# Patient Record
Sex: Female | Born: 1990 | Race: White | Hispanic: No | Smoking: Current every day smoker
Health system: Southern US, Community
[De-identification: ages and names within clinical notes are randomized; demographics above are authoritative.]

## PROBLEM LIST (undated history)

## (undated) DIAGNOSIS — O99345 Other mental disorders complicating the puerperium: Principal | ICD-10-CM

## (undated) DIAGNOSIS — F53 Postpartum depression: Principal | ICD-10-CM

## (undated) HISTORY — DX: Postpartum depression: F53.0

## (undated) HISTORY — DX: Other mental disorders complicating the puerperium: O99.345

---
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
## 2009-01-01 ENCOUNTER — Ambulatory Visit: Payer: Self-pay | Admitting: Family Medicine

## 2009-01-01 ENCOUNTER — Encounter: Payer: Self-pay | Admitting: Family Medicine

## 2009-01-01 LAB — CONVERTED CEMR LAB
Albumin: 3.8 g/dL (ref 3.5–5.2)
Alkaline Phosphatase: 246 units/L — ABNORMAL HIGH (ref 39–117)
BUN: 6 mg/dL (ref 6–23)
Calcium: 9.4 mg/dL (ref 8.4–10.5)
Chloride: 103 meq/L (ref 96–112)
Glucose, Bld: 62 mg/dL — ABNORMAL LOW (ref 70–99)
Hemoglobin: 12.5 g/dL (ref 12.0–15.0)
MCHC: 32.6 g/dL (ref 30.0–36.0)
Potassium: 3.9 meq/L (ref 3.5–5.3)
RBC: 4.53 M/uL (ref 3.87–5.11)
TSH: 2.233 microintl units/mL (ref 0.700–6.400)

## 2009-01-02 ENCOUNTER — Encounter: Payer: Self-pay | Admitting: Family Medicine

## 2009-01-02 ENCOUNTER — Ambulatory Visit (HOSPITAL_COMMUNITY): Admission: RE | Admit: 2009-01-02 | Discharge: 2009-01-02 | Payer: Self-pay | Admitting: Family Medicine

## 2009-01-08 ENCOUNTER — Encounter: Payer: Self-pay | Admitting: Family Medicine

## 2009-01-12 ENCOUNTER — Ambulatory Visit: Payer: Self-pay | Admitting: Obstetrics and Gynecology

## 2009-01-12 ENCOUNTER — Ambulatory Visit: Payer: Self-pay | Admitting: Family Medicine

## 2009-01-12 ENCOUNTER — Inpatient Hospital Stay (HOSPITAL_COMMUNITY): Admission: AD | Admit: 2009-01-12 | Discharge: 2009-01-16 | Payer: Self-pay | Admitting: Family Medicine

## 2009-01-12 ENCOUNTER — Encounter: Payer: Self-pay | Admitting: Family Medicine

## 2009-01-12 LAB — CONVERTED CEMR LAB
Chlamydia, DNA Probe: NEGATIVE
GC Probe Amp, Genital: POSITIVE — AB

## 2009-10-01 ENCOUNTER — Ambulatory Visit: Payer: Self-pay | Admitting: Family Medicine

## 2009-10-01 LAB — CONVERTED CEMR LAB: Beta hcg, urine, semiquantitative: POSITIVE

## 2009-12-21 ENCOUNTER — Encounter: Payer: Self-pay | Admitting: Family Medicine

## 2009-12-21 ENCOUNTER — Ambulatory Visit: Payer: Self-pay | Admitting: Family Medicine

## 2009-12-21 LAB — CONVERTED CEMR LAB
Basophils Absolute: 0 10*3/uL (ref 0.0–0.1)
Basophils Relative: 0 % (ref 0–1)
MCHC: 33.9 g/dL (ref 30.0–36.0)
Neutro Abs: 12.5 10*3/uL — ABNORMAL HIGH (ref 1.7–7.7)
Neutrophils Relative %: 79 % — ABNORMAL HIGH (ref 43–77)
Platelets: 273 10*3/uL (ref 150–400)
RDW: 14.2 % (ref 11.5–15.5)
Rubella: 68.8 intl units/mL — ABNORMAL HIGH
Sickle Cell Screen: NEGATIVE

## 2009-12-24 ENCOUNTER — Encounter: Payer: Self-pay | Admitting: Family Medicine

## 2009-12-28 ENCOUNTER — Encounter: Payer: Self-pay | Admitting: *Deleted

## 2010-01-17 ENCOUNTER — Ambulatory Visit: Payer: Self-pay | Admitting: Family Medicine

## 2010-01-17 ENCOUNTER — Encounter: Payer: Self-pay | Admitting: Family Medicine

## 2010-01-17 DIAGNOSIS — N390 Urinary tract infection, site not specified: Secondary | ICD-10-CM | POA: Insufficient documentation

## 2010-01-17 LAB — CONVERTED CEMR LAB
Chlamydia, DNA Probe: NEGATIVE
Pap Smear: NEGATIVE

## 2010-01-18 ENCOUNTER — Ambulatory Visit (HOSPITAL_COMMUNITY)
Admission: RE | Admit: 2010-01-18 | Discharge: 2010-01-18 | Payer: Self-pay | Source: Home / Self Care | Attending: Family Medicine | Admitting: Family Medicine

## 2010-02-06 ENCOUNTER — Ambulatory Visit: Payer: Self-pay | Admitting: Family Medicine

## 2010-02-26 ENCOUNTER — Telehealth: Payer: Self-pay | Admitting: Family Medicine

## 2010-02-28 ENCOUNTER — Ambulatory Visit: Admit: 2010-02-28 | Payer: Self-pay

## 2010-03-07 ENCOUNTER — Encounter: Payer: Self-pay | Admitting: Family Medicine

## 2010-03-07 ENCOUNTER — Other Ambulatory Visit: Payer: Self-pay | Admitting: Family Medicine

## 2010-03-07 ENCOUNTER — Ambulatory Visit: Admission: RE | Admit: 2010-03-07 | Discharge: 2010-03-07 | Payer: Self-pay | Source: Home / Self Care

## 2010-03-07 DIAGNOSIS — F17200 Nicotine dependence, unspecified, uncomplicated: Secondary | ICD-10-CM | POA: Insufficient documentation

## 2010-03-07 DIAGNOSIS — Z364 Encounter for antenatal screening for fetal growth retardation: Secondary | ICD-10-CM

## 2010-03-07 LAB — CONVERTED CEMR LAB
Bilirubin Urine: NEGATIVE
GC Probe Amp, Genital: NEGATIVE
Glucose, Urine, Semiquant: NEGATIVE
Specific Gravity, Urine: >=1.03
Urobilinogen, UA: 0.2
WBC Urine, dipstick: NEGATIVE

## 2010-03-08 ENCOUNTER — Encounter: Payer: Self-pay | Admitting: Family Medicine

## 2010-03-12 NOTE — Miscellaneous (Signed)
Summary: UTI Tx  Clinical Lists Changes  Medications: Added new medication of NITROFURANTOIN MONOHYD MACRO 100 MG CAPS (NITROFURANTOIN MONOHYD MACRO) one tab every 12 hrs with food

## 2010-03-12 NOTE — Assessment & Plan Note (Signed)
Summary: preg test,df   Vitals Entered By: Renato Battles slade,cma CC: pt does not know her date of LMP. has been  on the Depo, but d/c due to heavy bleeding. Pt has a 56 mos old baby and does not want to be pregnant. I have given to pt a verification letter for her pregnancy, so she can apply for Medicaid. She will sched. an OV with her PCP or first available MD. Then she will have an [place] and she wants to think about it, if she will have an abortion. She lives with her mother and she said, that she thinks, that her mother will kick her out of the house, if she has another baby.  Is Patient Diabetic? No Pain Assessment Patient in pain? no        CC:  pt does not know her date of LMP. has been  on the Depo, but d/c due to heavy bleeding. Pt has a 39 mos old baby and does not want to be pregnant. I have given to pt a verification letter for her pregnancy, so she can apply for Medicaid. She will sched. an OV with her PCP or first available MD. Then she will have an [place] and she wants to think about it, if she will have an abortion. She lives with her mother and she said, that she thinks, that her mother will kick her out of the house, and if she has another baby. .   Complete Medication List: 1)  Fluconazole 150 Mg Tabs (Fluconazole) .Marland Kitchen.. 1 tab by mouth x1 for yeast infection  Other Orders: U Preg-[facility] (16109) Est Level 1- [facility] [phone])  Laboratory Results   Urine Tests  Date/Time Received: October 01, 2009 3:41 PM  Date/Time Reported: October 01, 2009 4:03 PM     Urine HCG: positive Comments: ...........test performed by...........Marland KitchenTerese Door, CMA

## 2010-03-12 NOTE — Assessment & Plan Note (Signed)
Summary: NOB/DSL  Patient arrived late to NOB appt, was rescheduled .............................................Marland KitchenGaren Grams LPN December 28, 2009 3:43 PM    Complete Medication List: 1)  Fluconazole 150 Mg Tabs (Fluconazole) .Marland Kitchen.. 1 tab by mouth x1 for yeast infection 2)  Nitrofurantoin Monohyd Macro 100 Mg Caps (Nitrofurantoin monohyd macro) .... One tab every 12 hrs with food  Other Orders: No Charge Patient Arrived (NCPA0) (NCPA0)   Orders Added: 1)  No Charge Patient Arrived (NCPA0) [NCPA0]

## 2010-03-13 ENCOUNTER — Ambulatory Visit (HOSPITAL_COMMUNITY)
Admission: RE | Admit: 2010-03-13 | Discharge: 2010-03-13 | Disposition: A | Discharge status: Home / Self Care | Payer: Self-pay | Source: Ambulatory Visit | Attending: Family Medicine | Admitting: Family Medicine

## 2010-03-13 ENCOUNTER — Encounter (HOSPITAL_COMMUNITY): Payer: Self-pay

## 2010-03-13 DIAGNOSIS — Z364 Encounter for antenatal screening for fetal growth retardation: Secondary | ICD-10-CM

## 2010-03-13 DIAGNOSIS — Z3689 Encounter for other specified antenatal screening: Secondary | ICD-10-CM | POA: Insufficient documentation

## 2010-03-13 DIAGNOSIS — O365990 Maternal care for other known or suspected poor fetal growth, unspecified trimester, not applicable or unspecified: Secondary | ICD-10-CM | POA: Insufficient documentation

## 2010-03-14 NOTE — Assessment & Plan Note (Signed)
Summary: ob f/u [facility]   Vital Signs:  Patient profile:   20 year old female Weight:      150.4 pounds Temp:     98.3 degrees F oral Pulse rate:   94 / minute BP sitting:   101 / 64  Vitals Entered By: Alexandra Grams LPN (March 07, 2010 10:45 AM)  Primary Provider:  Angelena Sole MD   History of Present Illness: 20 yo G2P1001 at [redacted]w[redacted]d here for f/u OB.  No concerns this visit. Took antibiotics for UTI and BV.   Still having white vaginal discharge- denies itching and burning. Treated for gonorrhea at last visit. Not sure of FOB has been treated. Still sexually active with him. Not using condoms.   Nutrition- pt typically eats twice a day with a few snacks. Appetite decreased bc pt has cut back on smoking. Per pt appetite was better when smoking. Pt now smokes 2-3 cigs/day (was smoking 1PPD) and marijuana 2-3x/day (was smoking "all day"). Pt does not wake up in the middle of night. Does smoke first thing in the morning. Cut back in Oct/Nov. Taking prenatal vitamin.  PCM- Pt avoiding Alexandra Short. Spoke with pt about the benefits of working with [facility] (resources for more affordable baby clothes, counselliing, home visits from nurse). Advised pt to touch bases with Alexandra Short to get plugged in.      Social History: Packs/Day:  2-3 cigs/day  Physical Exam  General:  Vitas reviewed. well-developed, well-nourished, well-hydrated, and appropriate dress.  Smells of cigarette and marijuana smoke.  Abdomen:  gravid. non-tender.   postive fetal movement on exam. FH- 24 cm FHR-140 cm.   Pelvic Exam  Vulva:      normal appearance, normal hair distribution.   Vagina:      no lesions, copious discharge.  vaginal discharge-white, no odor.  Cervix:      normal, midposition, no CMT, no lesions.  cervix closed, long, high Uterus:      gravid.   Adnexa:      normal.      Impression & Recommendations:  Problem # 1:  PREGNANCY, NORMAL, MULTIGRAVIDA (ICD-V22.1) A: 19 yo G2P1001at 29w  and 1 d by 22 w U/S.  Labs: O +, Ab neg, Alexandra Short neg, RPR neg, HIV neg, Rubella immune. H/H 11.4/33.6, Plt 273, GBS neg. GC positive. Chlamydia negative.  ? Growth restriction - normal BP, pt smoking tobacco and marijuana.    P: U/S to assess growth 2/1/2.  d/u in OB clinic in 1 week. f/u with me in 2 weeks.   Orders: Prenatal U/S > 14 weeks - 81191 (Prenatal U/S) Other OB visit- [facility] (OBCK)  Problem # 2:  GONORRHEA (ICD-098.0) Pt treated at last visit (02/07/11) culture obtained for TOC.  If still positive- will repeat treatment with Rocephin 250 mg IM x 1 dose and Azithromycinc 1 gm by mouth x 1 dose per health department recs.   Orders: GC/Chlamydia-[facility] (87591/87491) Other OB visit- [facility] (OBCK)  Problem # 3:  BACTERIURIA (ICD-599.0)  Pt reports taking antibiotics. Called pharmacy-no record of antibiotics being picked up.  Reordered antibiotic. Called pt and LVM on cell phone to pick up and take meds.  Urine obtained today for TOC. Her updated medication list for this problem includes:    Nitrofurantoin Monohyd Macro 100 Mg Caps (Nitrofurantoin monohyd macro) ..... One tab two times a day for 7 days.  Orders: Other OB visit- [facility] (OBCK)  Problem # 4:  SMOKER (ICD-305.1) Tobacco and marijuana use. Cut  back. Advised pt to quit.  Will continue to encourage.   Orders: Other OB visit- [facility] (OBCK)  Complete Medication List: 1)  Acetaminophen 325 Mg Tabs (Acetaminophen) 2)  Nitrofurantoin Monohyd Macro 100 Mg Caps (Nitrofurantoin monohyd macro) .... One tab two times a day for 7 days. 3)  Calna Tabs (Prenatal vitamins) .... One tab q  daily  Other Orders: Urinalysis-[facility] (00000) Urine Culture-[facility] ([id number]) [facility] [employer] (41324)  Patient Instructions: 1)  Alexandra Short, 2)  It was good to see you again. 3)  Congratulations on your baby girl. 4)  Continue to take PNV. 5)  Drink plenty of water. 6)  Eat a well-balanced diet. 7)  Signs of labor: vaginal bleeding,  contractions that are regular (4 or more an  hour for 2 or more hours)  every 5 mins, leakage of fluids. These are reasons to go to [facility] for a check.  8)  Smoking- Do your very best to quit. It is extremely difficult, but try thinking of yourself as a non-smoker. 9)  F/U appt(s): 10)  -Ultrasound for growth 03/13/10 11)  -OB clinic next week. 12)  -ME in 2 weeks.  13)  -Dr. Armen Short Prescriptions: NITROFURANTOIN MONOHYD MACRO 100 MG CAPS (NITROFURANTOIN MONOHYD MACRO) one tab two times a day for 7 days.  #14 x 0   Entered and Authorized by:   Alexandra Phi MD   Signed by:   Alexandra Phi MD on 03/07/2010   Method used:   Electronically to        [facility] [address] (retail)       [address]       [place], [place]  [id number]       Ph: [phone]       Fax: [phone]   RxID:   [id number]    Orders Added: 1)  Urinalysis-[facility] [00000] 2)  Urine Culture-[facility] [[id number]] 3)  GC/Chlamydia-[facility] [87591/87491] 4)  Wet Prep- [facility] [57322] 5)  Prenatal U/S > 14 weeks - 02542 [Prenatal U/S] 6)  Other OB visit- [facility] [OBCK]     OB Initial Intake Information    Positive HCG by: self    Race: White    Marital status: Single    Occupation: NA    Education (last grade completed): 11th grade    Number of children at home: 1  FOB Information    Husband/Father of baby: Alexandra Short    FOB occupation Nothing    FOB Comments: Involved  Menstrual History    LMP (date): 08/02/2009    LMP - Character: normal    Menarche: 11 years    Menses interval: 29 days    Menstrual flow 4 days    On BCP's at conception: no    Date of positive (+) home preg. test: 06/05/2008   Flowsheet View for Follow-up Visit    Estimated weeks of       gestation:     29 1/7    Weight:     150.4    Blood pressure:   101 / 64    Urine Protein:     trace    Urine Glucose:   negative    Urine Nitrite:     negative    Headache:     No    Nausea/vomiting:   No    Edema:      0    Vaginal bleeding:   no    Vaginal discharge:   d/c    Fundal  height:      24 cm     Fetal activity:     yes    Labor symptoms:   no    Taking prenatal vits?   Y    Smoking:     2-3 cigs/day    Next visit:     1 wk    Resident:     [facility]    Flowsheet View for Follow-up Visit    Estimated weeks of       gestation:     29 1/7    Weight:     150.4    Blood pressure:   101 / 64    Urine protein:       trace    Urine glucose:    negative    Urine nitrite:     negative    Hx headache?     No    Nausea/vomiting?   No    Edema?     0    Bleeding?     no    Leakage/discharge?   d/c    Fetal activity:       yes    Labor symptoms?   no    Fundal height:      24 cm     Taking Vitamins?   Y    Smoking PPD:   2-3 cigs/day    Next visit:     1 wk    Resident:     [facility]  Laboratory Results   Urine Tests  Date/Time Received: March 07, 2010 11:24 AM  Date/Time Reported: March 07, 2010 12:01 PM   Routine Urinalysis   Color: yellow Appearance: Clear Glucose: negative   (Normal Range: Negative) Bilirubin: negative   (Normal Range: Negative) Ketone: trace (5)   (Normal Range: Negative) Spec. Gravity: >=1.030   (Normal Range: 1.003-1.035) Blood: negative   (Normal Range: Negative) pH: 6.5   (Normal Range: 5.0-8.0) Protein: trace   (Normal Range: Negative) Urobilinogen: 0.2   (Normal Range: 0-1) Nitrite: negative   (Normal Range: Negative) Leukocyte Esterace: negative   (Normal Range: Negative)    Comments: urine sent for culture ...........test performed by...........Marland KitchenTerese Door, CMA  Date/Time Received: March 07, 2010 11:24 AM  Date/Time Reported: March 07, 2010 11:55 AM   [place] Source: vaginal WBC/hpf: 10-20 Bacteria/hpf: 3+  Rods Clue cells/hpf: none  Negative whiff Yeast/hpf: moderate Trichomonas/hpf: none Comments: ...........test performed by...........Marland KitchenTerese Door, CMA

## 2010-03-14 NOTE — Progress Notes (Signed)
Summary: [facility] calling  Phone Note Other Incoming   Caller: Alexandra Short with Alexandra Short [phone] Reason for Call: Discuss lab or test results Action Taken: Phone Call Completed Summary of Call: Alexandra Short calling to see if patient was treated for her positive GC.  Advised patient was given 125mg  Rocephin on 02/06/10. Alexandra Short states the state of [place] recommends Rocephin 250mg  and Azithromycin 1 gram for treating Rocephin. She is requesting we repeat cultures on this  patient at her next appt.  Alexandra Short is scheduled to be seen on 02/28/10. Initial call taken by: Terese Door,  February 26, 2010 3:19 PM

## 2010-03-14 NOTE — Assessment & Plan Note (Signed)
Summary: NEW OB/EO  pt did not want flu shot today, but will have flu shot at her next visit.Arlyss Repress CMA,  January 17, 2010 3:46 PM  Vital Signs:  Patient profile:   20 year old female LMP:     08/02/2009 Height:      68 inches Weight:      140 pounds Pulse rate:   108 / minute BP sitting:   121 / 75  (left arm) Cuff size:   regular  Vitals Entered By: Tessie Fass CMA (January 17, 2010 2:13 PM) CC: NEW OB LMP (date): 08/02/2009 EDC by LMP==> 05/09/2010 EDC 05/09/2010 LMP - Character: normal LMP - Reliable? approximate (month known) Menarche (age onset years): 11   Menses interval (days): 29 Menstrual flow (days): 4 On BCP's at conception: no Date of + home preg. test: 06/05/2008 Enter LMP: 08/02/2009 Last PAP Result NEGATIVE FOR INTRAEPITHELIAL LESIONS OR MALIGNANCY.   Visit Type:  New OB Primary Provider:  Angelena Sole MD  CC:  NEW OB.  History of Present Illness: Here to establish care. Came last week for new OB visit but had to reschedule bc she was late. She has no complaints. She is presenting late to care bc of lack of unsurace and because this is her second pregnancy. She OB intake and flowsheet for details.   Family History: Mom: HTN Dad: ? DM Uncles and Aunt: DMII  Social History: Lives with mom, dad, daughter and  older sister.  All of them smoke.  Pt denies ETOH Prev smoked 4-5 cigs/day until October Prev smoked marijuana until October. Denies other IDU. 11th grade education. Plans to enroll in [employer] for GED and from there go on to train in [employer].  FOB involved-Bryant Hightower. Packs/Day:  n/a Education:  11th grade  Review of Systems       As per OB flowsheet  Physical Exam  General:  Vitas reviewed. well-developed, well-nourished, well-hydrated, and appropriate dress.  Smells of cigarette smoke.  Neck:  No deformities, masses, or tenderness noted. Lungs:  normal respiratory effort.   Heart:  RRR, no murmurs, and no  gallops.   Abdomen:  gravid, small for gestation age meausrement Genitalia:  normal introitus, no external lesions, and white  vaginal discharge.   endocervical ectropion evidenced by hyperemic external os. No blood in vault.   Msk:  normal ROM, no joint tenderness, and no joint swelling.   Pulses:  R and L radial, DP normal Neurologic:  alert & oriented X3, cranial nerves II-XII intact, strength normal in all extremities, and sensation intact to light touch.     Impression & Recommendations:  Problem # 1:  SUPERVISION, NORMAL PREGNANCY, PRIMIGRAVIDA (ICD-V22.0) A: 19 yo G2P1001at 24.1 by moderately certain LMP giving an EDC of 05/09/10 Labs: O +, Ab neg, [place] neg, RPR neg, HIV neg, Rubella immune. H/H 11.4/33.6, Plt 273, GBS neg.  P: [place] for anatomy and dating fill out PMH form See pt instructions for info. Provide letter for proof of pregnancy Orders: Prenatal U/S > 14 weeks - 04540 (Prenatal U/S) Prenatal U/S > 14 weeks - 98119 (Prenatal U/S) Other OB visit- [facility] (OBCK)  Problem # 2:  GONORRHEA (ICD-098.0) Positive Gonorrhea probe. Called pt at home on 01/22/10 at 11:55 AM to inform her of test results. Pt will come into clinic this week to have shot of Rocephin. 125 mg IM x 1.   Problem # 3:  VAGINAL DISCHARGE (ICD-623.5) Pt positive for bacterial vaginosis. Pt informed  of results on 01/22/10 @ 11:50 AM. Pt informed that prescription for flagyl will be sent to pharmacy. Pt has had BV in the past.  [facility]  Problem # 4:  BACTERIURIA (ICD-599.0) Pt aware of UTI but has not picked up prescription. She will do so once her insurance is confirmed. Her updated medication list for this problem includes:    Nitrofurantoin Monohyd Macro 100 Mg Caps (Nitrofurantoin monohyd macro) ..... One tab two times a day for 7 days.  Complete Medication List: 1)  Metronidazole 500 Mg Tabs (Metronidazole) .... One tab twice daily for 7 days. 2)  Acetaminophen 325 Mg Tabs  (Acetaminophen) 3)  Nitrofurantoin Monohyd Macro 100 Mg Caps (Nitrofurantoin monohyd macro) .... One tab two times a day for 7 days. 4)  Calna Tabs (Prenatal vitamins) .... One tab q  daily  Other Orders: Pap Smear-[facility] ([id number]) GC/Chlamydia-[facility] (87591/87491)  Patient Instructions: 1)  Sherolyn, 2)  It was a pleasure meeting you. 3)  Congratulations on your baby. 4)  Before we meet again you will have an U/S for dating and anatomy. 5)  Continue to take PNV. 6)  Drink plenty of water. 7)  Eat a well-balanced diet. 8)  For your UTI: you need to take the antibiotics as soon as possible.  9)  Signs of labor: vaginal bleeding, contractions that are regular every 5 mins, leakage of fluids. These are reasons to go to [facility] for a check.  10)  f/u in 2-3 weeks. 11)  -Dr. Armen Pickup Prescriptions: CALNA  TABS (PRENATAL VITAMINS) one tab q  daily  #90 x 3   Entered and Authorized by:   Dessa Phi MD   Signed by:   Dessa Phi MD on 01/23/2010   Method used:   Electronically to        [facility] (retail)       [address]       [place], [place]  [id number]       Ph: [phone]       Fax: [phone]   RxID:   [id number] METRONIDAZOLE 500 MG TABS (METRONIDAZOLE) one tab twice daily for 7 days.  #14 x 0   Entered and Authorized by:   Dessa Phi MD   Signed by:   Dessa Phi MD on 01/22/2010   Method used:   Electronically to        [facility] (retail)       [address]       [place], [place]  [id number]       Ph: [phone]       Fax: [phone]   RxID:   [id number]    Orders Added: 1)  Pap Smear-[facility] [[id number]] 2)  GC/Chlamydia-[facility] [87591/87491] 3)  Wet Prep- [facility] [23762] 4)  Prenatal U/S > 14 weeks - 83151 [Prenatal U/S] 5)  Prenatal U/S > 14 weeks - 76160 [Prenatal U/S] 6)  Other OB visit- [facility] [OBCK]     OB Initial Intake Information    Positive HCG by: self    Race:  White    Marital status: Single    Occupation: NA    Education (last grade completed): 11th grade    Number of children at home: 1  FOB Information    Husband/Father of baby: Drenda Freeze    FOB occupation Nothing    FOB Comments: Involved  Menstrual History    LMP (date): 08/02/2009    EDC by  LMP: 05/09/2010    Best Working EDC: 05/09/2010    LMP - Character: normal    LMP - Reliable? : approximate (month known)    Menarche: 11 years    Menses interval: 29 days    Menstrual flow 4 days    On BCP's at conception: no    Date of positive (+) home preg. test: 06/05/2008    Symptoms since LMP: tender breasts   Flowsheet View for Follow-up Visit    Estimated weeks of       gestation:     24 0/7    Weight:     140    Blood pressure:   121 / 75    Headache:     No    Nausea/vomiting:   No    Edema:     0    Vaginal bleeding:   no    Vaginal discharge:   no    Fundal height:      21    FHR:       140    Fetal activity:     yes    Labor symptoms:   no    Fetal position:     N/A    Cx Dilation:     0    Cx Effacement:   0    Cx Station:     high    Taking prenatal vits?   N    Smoking:     n/a    Next visit:     2 wk    Resident:     [facility]    Preceptor:     Hensel     Flowsheet View for Follow-up Visit    Estimated weeks of       gestation:     24 0/7    Weight:     140    Blood pressure:   121 / 75    Hx headache?     No    Nausea/vomiting?   No    Edema?     0    Bleeding?     no    Leakage/discharge?   no    Fetal activity:       yes    Labor symptoms?   no    Fundal height:      21    FHR:       140    Fetal position:      N/A    Cx dilation:     0    Cx effacement:   0    Fetal station:     high    Taking Vitamins?   N    Smoking PPD:   n/a    Next visit:     2 wk    Resident:     [facility]    Preceptor:     Hensel   Laboratory Results  Date/Time Received: January 17, 2010 3:22 PM  Date/Time Reported: January 17, 2010 3:35 PM   [employer] Source: vaginal WBC/hpf: 10-20 Bacteria/hpf: 3+  Cocci Clue cells/hpf: few  Positive whiff Yeast/hpf: none Trichomonas/hpf: none Comments: rod bacteria also present ...........test performed by...........Marland KitchenTerese Door, CMA    Prenatal Visit    FOB name: Drenda Freeze [facility] Confirmation:    New working [facility]: 05/09/2010    LMP reliable? approximate (month known)    Last menses onset (LMP) date: 08/02/2009    EDC by LMP: 05/09/2010  Past Pregnancy History    Gravida:     2    Term Births:     1    Living Children:   1  Pregnancy # [redacted]    Weeks Gestation:   39.5    Preterm labor:     no    Delivery type:     NSVD    Anesthesia type:     epidural    Delivery location:     [facility]    Infant Sex:     Female    Birth weight:     7 lbs    Comments:     IOL for poor weight gain per pt.    Genetic History    Father of baby:   Drenda Freeze     Thalassemia:         father: no    Neural tube defect:       father: no    Down's Syndrome:       father: no    Tay-Sachs:         father: no    Sickle Cell Dz/Trait:       father: no    Hemophilia:         father: no    Muscular Dystrophy:       father: no    Cystic Fibrosis:       father: no    Huntington's Dz:       father: no    Mental Retardation:       father: no    Fragile X:         father: no    Other Genetic or       Chromosomal Dz:       father: no    Child with other       birth defect:         father: no  Additional Genetic Comments:    Pt expresses no concerns/worries regarding genetic diseases/defects.  Genetic Counseling:    Presented too late for genetic screening.   Infection Risk History    Infection Risk History Reviewed:    High Risk Hepatitis B: no    Immunized against Hepatitis B: yes    Exposure to TB: no    Patient with history of Genital Herpes: no    Sexual partner with history of Genital Herpes: no    History of STD (GC, Chlamydia, Syphilis, HPV): no    Specific STD: GC    Rash,  Viral, or Febrile Illness since LMP: no    Exposure to Cat Litter: no    Chicken Pox Immune Status: Hx of Disease: Immune    History of Parvovirus (Fifth Desease): no    Occupational Exposure to Children: none  Environmental Exposures    Environmental Exposures Reviewed:    Xray Exposure since LMP: no    Chemical or other exposure: no    Medication, drug, or alcohol use since LMP: yes   Appended Document: NEW OB/EO pt presents late to care and has h/o substance abuse early in pregnancy -will need social work consult when delivers.  Also needs glucola screen.  Flu shot offered.  Too late for genetic screening.  Needs to take abx for her asymptomatic bacteruria.

## 2010-03-14 NOTE — Assessment & Plan Note (Signed)
Summary: ob/kh   Vital Signs:  Patient profile:   20 year old female Weight:      151.4 pounds Pulse rate:   94 / minute BP sitting:   99 / 68  Vitals Entered By: Garen Grams LPN (February 06, 2010 2:18 PM) CC: f/u OB   Primary Provider:  Angelena Sole MD  CC:  f/u OB.  History of Present Illness: 20 yo G2P1001 at 26.6 by LMP here for f/u OB visit.  No complaints.  Ultrasound for dating complete-baby girl. Pt has yet to receive treatment for gonorrhea.   See OB flowsheet for ROS.   Physical Exam  General:  Vitas reviewed. well-developed, well-nourished, well-hydrated, and appropriate dress.  Smells of cigarette smoke.  Abdomen:  gravid, small for gestation age meausrement. 21.5 cm.    Impression & Recommendations:  Problem # 1:  PREGNANCY, NORMAL, MULTIGRAVIDA (ICD-V22.1) A: 20 yo G2P1001at 25w and 0 d by 22 w U/S.  Labs: O +, Ab neg, [place] neg, RPR neg, HIV neg, Rubella immune. H/H 11.4/33.6, Plt 273, GBS neg. GC positive. Chlamydia negative.  ? Growth restriction - normal BP, pt denies substance abuse.   P: Rocephin today for Gonorrhea. Flu shot today. RTC in 2 weeks for growth monitoring, repeat U/S if growth continues to plateau.  1 hr glucola. Orders: Other OB visit- [facility] (OBCK)  Complete Medication List: 1)  Acetaminophen 325 Mg Tabs (Acetaminophen) 2)  Nitrofurantoin Monohyd Macro 100 Mg Caps (Nitrofurantoin monohyd macro) .... One tab two times a day for 7 days. 3)  Calna Tabs (Prenatal vitamins) .... One tab q  daily  Other Orders: Glucose 1 hr-[facility] (81191) Rocephin  250mg  (Y7829) Flu Vaccine 60yrs + (56213) Admin 1st Vaccine (08657)  Patient Instructions: 1)  Simone, 2)  It was good to see you again. 3)  Congratulations on your baby girl. 4)  I would like to monitor your growth: please return in 2 week.  5)  Continue to take PNV. 6)  Drink plenty of water. 7)  Eat a well-balanced diet. 8)  For the bacteria in your urine and the BVI: you  need to take the antibiotics as soon as possible.  9)  Signs of labor: vaginal bleeding, contractions that are regular every 5 mins, leakage of fluids. These are reasons to go to [facility] for a check.  10)  f/u in 2. 11)  -Dr. Armen Pickup   Medication Administration  Injection # 1:    Medication: Rocephin  250mg     Diagnosis: GONORRHEA (ICD-098.0)    Route: IM    Site: LUOQ gluteus    Exp Date: 06/10/2012    Lot #: [id number]    Mfr: Hospira    Comments: Patient recieved 125 mg of rocephin    Patient tolerated injection without complications    Given by: Garen Grams LPN (February 06, 2010 2:55 PM)  Orders Added: 1)  Glucose 1 hr-[facility] [82950] 2)  Rocephin  250mg  [J0696] 3)  Flu Vaccine 39yrs + [90658] 4)  Admin 1st Vaccine [90471] 5)  Other OB visit- [facility] [OBCK]   Immunizations Administered:  Influenza Vaccine # 1:    Vaccine Type: Fluvax 3+    Site: left deltoid    Mfr: GlaxoSmithKline    Dose: 0.5 ml    Route: IM    Given by: Garen Grams LPN    Exp. Date: 08/10/2010    Lot #: [id number]    VIS given: 09/04/09 version given February 06, 2010.  Flu Vaccine  Consent Questions:    Do you have a history of severe allergic reactions to this vaccine? no    Any prior history of allergic reactions to egg and/or gelatin? no    Do you have a sensitivity to the preservative Thimersol? no    Do you have a past history of Guillan-Barre Syndrome? no    Do you currently have an acute febrile illness? no    Have you ever had a severe reaction to latex? no    Vaccine information given and explained to patient? yes    Are you currently pregnant? no   Immunizations Administered:  Influenza Vaccine # 1:    Vaccine Type: Fluvax 3+    Site: left deltoid    Mfr: GlaxoSmithKline    Dose: 0.5 ml    Route: IM    Given by: Garen Grams LPN    Exp. Date: 08/10/2010    Lot #: [id number]    VIS given: 09/04/09 version given February 06, 2010.   OB Initial Intake Information    Positive  HCG by: self    Race: White    Marital status: Single    Occupation: NA    Education (last grade completed): 11th grade    Number of children at home: 1  FOB Information    Husband/Father of baby: Drenda Freeze    FOB occupation Nothing    FOB Comments: Involved  Menstrual History    LMP (date): 08/02/2009    LMP - Character: normal    Menarche: 11 years    Menses interval: 29 days    Menstrual flow 4 days    On BCP's at conception: no    Date of positive (+) home preg. test: 06/05/2008   Flowsheet View for Follow-up Visit    Estimated weeks of       gestation:     25 0/7    Weight:     151.4    Blood pressure:   99 / 68    Headache:     No    Nausea/vomiting:   No    Edema:     0    Vaginal bleeding:   no    Vaginal discharge:   no    Fundal height:      21.5    FHR:       145    Fetal activity:     yes    Labor symptoms:   no    Taking prenatal vits?   Y    Smoking:     n/a    Resident:     Armen Pickup    Preceptor:     Charlesetta [place] for Follow-up Visit    Estimated weeks of       gestation:     25 0/7    Weight:     151.4    Blood pressure:   99 / 68    Hx headache?     No    Nausea/vomiting?   No    Edema?     0    Bleeding?     no    Leakage/discharge?   no    Fetal activity:       yes    Labor symptoms?   no    Fundal height:      21.5    FHR:       145    Taking Vitamins?   Jeannie Fend  Smoking PPD:   n/a    Resident:     Armen Pickup    Preceptor:     Sheffield Slider   Medication Administration  Injection # 1:    Medication: Rocephin  250mg     Diagnosis: GONORRHEA (ICD-098.0)    Route: IM    Site: LUOQ gluteus    Exp Date: 06/10/2012    Lot #: [id number]    Mfr: Hospira    Comments: Patient recieved 125 mg of rocephin    Patient tolerated injection without complications    Given by: Garen Grams LPN (February 06, 2010 2:55 PM)  Orders Added: 1)  Glucose 1 hr-[facility] [82950] 2)  Rocephin  250mg  [J0696] 3)  Flu Vaccine 14yrs + [90658] 4)  Admin 1st  Vaccine [90471] 5)  Other OB visit- [facility] [OBCK]    OB Initial Intake Information    Positive HCG by: self    Race: White    Marital status: Single    Occupation: NA    Education (last grade completed): 11th grade    Number of children at home: 1  FOB Information    Husband/Father of baby: Drenda Freeze    FOB occupation Nothing    FOB Comments: Involved  Menstrual History    LMP (date): 08/02/2009    LMP - Character: normal    Menarche: 11 years    Menses interval: 29 days    Menstrual flow 4 days    On BCP's at conception: no    Date of positive (+) home preg. test: 06/05/2008   OB Ultrasound Data Entry   Ultrasound Date:     01/18/2010   Indication:       Uncert dates   Cervical length:     3.5 cm         Adnexa by [place]:     Normal         Fetal number:       1              Fetal position:       Cephalic       Cardiac motion:     Yes            Fetal anomalies:     See report     Amniotic Fl Index (cm):   subjectively   Placenta location:     Fundal         Placenta previa?     No           Measurements:   Bi-Parietal diam (cm):     5.5            Head circumference (cm):   20.3           Abdominal circ (cm):     17.2           Femur length (cm):     3.6            HC/AC ratio:       1.18           FL/BPD ratio:       0.66           Est Fetal Weight (g):     472          Dating:   Gest age by [place]:     22W 2D         [facility] by [place] Gest age:     05/22/2010   Impression:  Impression:       S IUP. New EDC. Echogenic focus LV. Finding is a soft marker for Down Syndrome, but given isolated finding unlikely to be clinically significant. Recommend correlation with anueploidy screening eval if available. Best Working [facility]:    05/22/2010   Past Pregnancy History  Pregnancy # 1    Birth weight:     6 lbs 1 oz

## 2010-03-21 ENCOUNTER — Ambulatory Visit (INDEPENDENT_AMBULATORY_CARE_PROVIDER_SITE_OTHER): Payer: Medicaid Other | Admitting: Family Medicine

## 2010-03-21 ENCOUNTER — Encounter: Payer: Self-pay | Admitting: Family Medicine

## 2010-03-21 VITALS — BP 104/60 | Wt 147.6 lb

## 2010-03-21 DIAGNOSIS — Z3480 Encounter for supervision of other normal pregnancy, unspecified trimester: Secondary | ICD-10-CM

## 2010-03-21 DIAGNOSIS — Z3490 Encounter for supervision of normal pregnancy, unspecified, unspecified trimester: Secondary | ICD-10-CM

## 2010-03-21 NOTE — Patient Instructions (Addendum)
Alexandra Short,  Thank you for coming in today. Please continue to drink plenty of fluids and eat well and take your prenatal vitamins, You are down to smoking 1-2 x/day and that is great, please do your best to cut back.   Please watch out for signs of preterm labor: vaginal bleeding, leakage or gush of fluids, 4 or more contractions for 2 or more hours. Please call the clinic if any of these occur and go to [facility].

## 2010-04-04 ENCOUNTER — Encounter: Payer: Medicaid Other | Admitting: Family Medicine

## 2010-04-08 ENCOUNTER — Encounter: Payer: Self-pay | Admitting: Family Medicine

## 2010-04-11 ENCOUNTER — Encounter: Payer: Self-pay | Admitting: Family Medicine

## 2010-04-11 ENCOUNTER — Ambulatory Visit (INDEPENDENT_AMBULATORY_CARE_PROVIDER_SITE_OTHER): Payer: Medicaid Other | Admitting: Family Medicine

## 2010-04-11 DIAGNOSIS — F17200 Nicotine dependence, unspecified, uncomplicated: Secondary | ICD-10-CM

## 2010-04-11 DIAGNOSIS — Z3490 Encounter for supervision of normal pregnancy, unspecified, unspecified trimester: Secondary | ICD-10-CM | POA: Insufficient documentation

## 2010-04-11 DIAGNOSIS — Z3480 Encounter for supervision of other normal pregnancy, unspecified trimester: Secondary | ICD-10-CM

## 2010-04-11 LAB — CBC
HCT: 32 % — ABNORMAL LOW (ref 36.0–46.0)
Hemoglobin: 10.6 g/dL — ABNORMAL LOW (ref 12.0–15.0)
MCHC: 33.1 g/dL (ref 30.0–36.0)
RBC: 3.81 MIL/uL — ABNORMAL LOW (ref 3.87–5.11)
WBC: 16.4 10*3/uL — ABNORMAL HIGH (ref 4.0–10.5)

## 2010-04-11 LAB — CONVERTED CEMR LAB
HCT: 32 % — ABNORMAL LOW (ref 36.0–46.0)
Hemoglobin: 10.6 g/dL — ABNORMAL LOW (ref 12.0–15.0)
RBC: 3.81 M/uL — ABNORMAL LOW (ref 3.87–5.11)
WBC: 16.4 10*3/uL — ABNORMAL HIGH (ref 4.0–10.5)

## 2010-04-11 LAB — RPR

## 2010-04-11 NOTE — Assessment & Plan Note (Signed)
Improving, pt admits to smoking cigarettes and marijuana about once daily.  P: Encouraged continued cutting back with the goal of quitting.

## 2010-04-11 NOTE — Patient Instructions (Addendum)
Alexandra Short,   Please watch out for signs of preterm labor: vaginal bleeding, leakage or gush of fluids, 4 or more contractions for 2 or more hours. Please call the clinic if any of these occur and go to [facility].   Decreased fetal movement: anything less than 10 kicks an hour for 2 or more hours. When counting you should be in a quite place.    Drink plenty of fluids and continue to eat well.  Continue to cut down on the smoking.

## 2010-04-11 NOTE — Progress Notes (Signed)
  Subjective:    Patient ID: Thamas Jaegers, female    DOB: 1990/04/04, 20 y.o.   MRN: [id number]  HPI Here for f/u OB visit.  Complaints/concerns: 1. Upper thigh pain- worse at night. Pain makes it difficult to turn over. Still sleeping well. Takes her time getting out of bed in the morning. Pain 6-7/10. Has not tried anything to relieve pain. Denies back pain. Has some pubic pressure. Denies contractions.   She is eating well, drinking plenty of fluids. Taking PNV.     Review of Systems +: fetal movement, pelvic pressure.  -: HA, N/V, edema, VB, vaginal discharge, leakage of fluids, abdominal pain.      Objective:   Physical Exam Gen: Smells of marijuana smoke. NAD. Appropriate to exam.  Abd: Gravid, FM appreciated on palpation.  Ext: NT, no edema.      Assessment & Plan:  20 yo G2 P1001 @ [redacted]w[redacted]d  Reviewed prenatal labs. O+. TOC for GC was negative.  Plan: check T3 labs-H/H, RPR, HIV.  Feeding:breast Contraception: Implanon or IUD Epidural:?  F/u in 2 weeks @ OB clinic In 3 weeks with me .

## 2010-04-11 NOTE — Assessment & Plan Note (Signed)
19 yo G2P1001 at 34.1. Reassuring weight gain and fundal height measurement this visit.  Expecting baby girl: Alexandra  Short; f/u in OB clinic in 2 weeks.  F/u with me in 3 weeks.  GBS and repeat GC/Chlam at f/u appointment.  Contraception: Implanon vs. IUD Feeding: Plans to try breastfeeding (did not BF with first baby). Epidural: ? Baby's doctor: ?

## 2010-04-22 LAB — GLUCOSE, CAPILLARY: Glucose-Capillary: 92 mg/dL (ref 70–99)

## 2010-04-23 ENCOUNTER — Telehealth: Payer: Self-pay | Admitting: Family Medicine

## 2010-04-23 ENCOUNTER — Inpatient Hospital Stay (HOSPITAL_COMMUNITY)
Admission: AD | Admit: 2010-04-23 | Discharge: 2010-04-23 | Disposition: A | Discharge status: Home / Self Care | Payer: BC Managed Care – PPO | Source: Ambulatory Visit | Attending: Obstetrics & Gynecology | Admitting: Obstetrics & Gynecology

## 2010-04-23 ENCOUNTER — Encounter: Payer: Medicaid Other | Admitting: Family Medicine

## 2010-04-23 DIAGNOSIS — R109 Unspecified abdominal pain: Secondary | ICD-10-CM

## 2010-04-23 DIAGNOSIS — O99891 Other specified diseases and conditions complicating pregnancy: Secondary | ICD-10-CM | POA: Insufficient documentation

## 2010-04-23 DIAGNOSIS — O9989 Other specified diseases and conditions complicating pregnancy, childbirth and the puerperium: Secondary | ICD-10-CM

## 2010-04-23 LAB — URINALYSIS, ROUTINE W REFLEX MICROSCOPIC
Nitrite: NEGATIVE
Specific Gravity, Urine: 1.02 (ref 1.005–1.030)
pH: 7 (ref 5.0–8.0)

## 2010-04-23 NOTE — Telephone Encounter (Signed)
[facility], [facility] need [facility], only has 10/2009, needs most recent faxed to [phone]

## 2010-04-23 NOTE — Telephone Encounter (Signed)
Will route to [employer] .

## 2010-04-23 NOTE — Telephone Encounter (Signed)
faxed

## 2010-04-30 ENCOUNTER — Ambulatory Visit: Payer: Medicaid Other

## 2010-05-13 ENCOUNTER — Inpatient Hospital Stay (HOSPITAL_COMMUNITY)
Admission: AD | Admit: 2010-05-13 | Discharge: 2010-05-13 | Disposition: A | Discharge status: Home / Self Care | Payer: BC Managed Care – PPO | Source: Ambulatory Visit | Attending: Obstetrics & Gynecology | Admitting: Obstetrics & Gynecology

## 2010-05-13 DIAGNOSIS — R42 Dizziness and giddiness: Secondary | ICD-10-CM | POA: Insufficient documentation

## 2010-05-13 DIAGNOSIS — O9989 Other specified diseases and conditions complicating pregnancy, childbirth and the puerperium: Secondary | ICD-10-CM

## 2010-05-13 DIAGNOSIS — O99891 Other specified diseases and conditions complicating pregnancy: Secondary | ICD-10-CM | POA: Insufficient documentation

## 2010-05-14 LAB — CBC
MCHC: 32.4 g/dL (ref 30.0–36.0)
MCV: 86.4 fL (ref 78.0–100.0)
Platelets: 205 10*3/uL (ref 150–400)
Platelets: 232 10*3/uL (ref 150–400)
RBC: 4.29 MIL/uL (ref 3.87–5.11)
RDW: 13.3 % (ref 11.5–15.5)
RDW: 13.4 % (ref 11.5–15.5)
WBC: 18.6 10*3/uL — ABNORMAL HIGH (ref 4.0–10.5)

## 2010-05-14 LAB — RPR: RPR Ser Ql: NONREACTIVE

## 2010-05-15 LAB — STREP B DNA PROBE: Strep Group B Ag: POSITIVE

## 2010-05-16 ENCOUNTER — Inpatient Hospital Stay (HOSPITAL_COMMUNITY)
Admission: AD | Admit: 2010-05-16 | Discharge: 2010-05-19 | DRG: 775 | Disposition: A | Discharge status: Home / Self Care | Payer: Medicaid Other | Source: Ambulatory Visit | Attending: Obstetrics & Gynecology | Admitting: Obstetrics & Gynecology

## 2010-05-16 ENCOUNTER — Ambulatory Visit (INDEPENDENT_AMBULATORY_CARE_PROVIDER_SITE_OTHER): Payer: Medicaid Other | Admitting: Family Medicine

## 2010-05-16 DIAGNOSIS — O99892 Other specified diseases and conditions complicating childbirth: Principal | ICD-10-CM | POA: Diagnosis present

## 2010-05-16 DIAGNOSIS — O9989 Other specified diseases and conditions complicating pregnancy, childbirth and the puerperium: Secondary | ICD-10-CM

## 2010-05-16 DIAGNOSIS — Z3480 Encounter for supervision of other normal pregnancy, unspecified trimester: Secondary | ICD-10-CM

## 2010-05-16 DIAGNOSIS — Z22330 Carrier of Group B streptococcus: Secondary | ICD-10-CM

## 2010-05-16 LAB — CBC
MCH: 26.9 pg (ref 26.0–34.0)
MCHC: 33 g/dL (ref 30.0–36.0)
RDW: 13.8 % (ref 11.5–15.5)

## 2010-05-16 LAB — RPR: RPR Ser Ql: NONREACTIVE

## 2010-05-16 NOTE — Progress Notes (Signed)
20 y/o F G2P1001 at 49 1/7 wga with regular contractions x 24 hours.  GBS positive. NKDA.  Discussed PCN during labor.  CVE 4/70/-1.  Will send to [facility].  Subjective:    Alexandra Short is a 20 y.o. female being seen today for her obstetrical visit. She is at [redacted]w[redacted]d gestation. Patient reports contractions since yesterday.. Fetal movement: normal. No bleeding.  No headache.  No vision change.  Swelling trace.   Menstrual History: OB History    Grav Para Term Preterm Abortions TAB SAB Ect Mult Living   2 1 1  0 0 0 0 0 0 1       Patient's last menstrual period was 08/02/2009.    The following portions of the patient's history were reviewed and updated as appropriate: past family history, past medical history, past social history, past surgical history and problem list.  Review of Systems Pertinent items are noted in HPI.   Objective:    BP 110/60  Wt 151 lb (68.493 kg)  LMP 08/02/2009  Breastfeeding? Unknown FHT: 135 BPM  Uterine Size: 37 cm  Presentations: cephalic  Pelvic Exam:              Dilation: 4cm       Effacement: 75%             Station:  -1    Consistency: firm            Position: anterior     Assessment:    Pregnancy 2 and 39 1/7 weeks   Plan:   Plans for delivery: Vaginal anticipated Beta strep culture: done, Positive Will send as direct admit to L&D.

## 2010-05-17 LAB — GLUCOSE, CAPILLARY: Glucose-Capillary: 136 mg/dL — ABNORMAL HIGH (ref 70–99)

## 2010-05-22 ENCOUNTER — Ambulatory Visit (INDEPENDENT_AMBULATORY_CARE_PROVIDER_SITE_OTHER): Payer: Medicaid Other | Admitting: *Deleted

## 2010-05-22 ENCOUNTER — Other Ambulatory Visit: Payer: Self-pay | Admitting: Family Medicine

## 2010-05-22 DIAGNOSIS — Z309 Encounter for contraceptive management, unspecified: Secondary | ICD-10-CM

## 2010-05-22 LAB — POCT URINALYSIS DIP (DEVICE)
Glucose, UA: 100 mg/dL — AB
Hgb urine dipstick: NEGATIVE
Nitrite: POSITIVE — AB
Specific Gravity, Urine: >=1.03 (ref 1.005–1.030)
Urobilinogen, UA: 0.2 mg/dL (ref 0.0–1.0)
pH: 5.5 (ref 5.0–8.0)

## 2010-05-22 MED ORDER — MEDROXYPROGESTERONE ACETATE 150 MG/ML IM SUSP
150.0000 mg | Freq: Once | INTRAMUSCULAR | Status: AC
  Administered 2010-05-22: 150 mg via INTRAMUSCULAR

## 2010-05-22 MED ORDER — MEDROXYPROGESTERONE ACETATE 150 MG/ML IM SUSP: 150.0000 mg | INTRAMUSCULAR | Status: DC

## 2010-05-22 NOTE — Progress Notes (Signed)
Delivered baby 05/17/2010 . Was suppose to have received depo before discharge but did not. RN checked medical record from [facility] and it was ordered but not given . Dr. Lelon Perla notified and he placed order. Consulted with preceptor and patient does not require a u preg at this time.

## 2010-06-11 ENCOUNTER — Ambulatory Visit (INDEPENDENT_AMBULATORY_CARE_PROVIDER_SITE_OTHER): Payer: Medicaid Other | Admitting: Family Medicine

## 2010-06-11 ENCOUNTER — Encounter: Payer: Self-pay | Admitting: Family Medicine

## 2010-06-11 VITALS — BP 108/71 | HR 76 | Temp 98.2°F | Ht 68.0 in | Wt 135.4 lb

## 2010-06-11 DIAGNOSIS — O99345 Other mental disorders complicating the puerperium: Secondary | ICD-10-CM

## 2010-06-11 DIAGNOSIS — IMO0002 Reserved for concepts with insufficient information to code with codable children: Secondary | ICD-10-CM

## 2010-06-11 DIAGNOSIS — F53 Postpartum depression: Secondary | ICD-10-CM

## 2010-06-11 HISTORY — DX: Other mental disorders complicating the puerperium: O99.345

## 2010-06-11 HISTORY — DX: Postpartum depression: F53.0

## 2010-06-11 MED ORDER — SERTRALINE HCL 50 MG PO TABS: 50.0000 mg | ORAL_TABLET | Freq: Every day | ORAL | Status: DC

## 2010-06-11 NOTE — Assessment & Plan Note (Signed)
Symptoms c/w pp depression.  She had similar episode with last pregnancy.  She does have occassional thoughts of death but denies any active SI, intent, or plan.  Still had patient contract for safety.  Will treat with Zoloft and have her schedule an appointment with Dr. Pascal Lux.  Will have patient f/u in 1-2 weeks per guidelines.

## 2010-06-11 NOTE — Progress Notes (Signed)
  Subjective:    Patient ID: Alexandra Short, female    DOB: Jun 08, 1990, 20 y.o.   MRN: [id number]  HPI  1. Postpartum Depression:  Pt has had symptoms of depression for about the past 2 weeks.  She endorses anhedonia, depressed mood, trouble sleeping, poor appetite, poor self esteem.  She had a similar reaction after her first pregnancy.  She didn't require any treatment after her first delivery and it just got better on its own. She is about 4 weeks out from delivery.  She has her sister for emotional support.  She does endorse occassional thoughts of death but denies any suicidal ideation or intent.  Denies any sort of plan and knows that she would never do anything to hurt herself  Review of Systems Denies weight gain, constipation.  Endorses fatigue.    Objective:   Physical Exam  Vitals reviewed. Constitutional: She is oriented to person, place, and time. She appears well-nourished. No distress.  Eyes: Conjunctivae are normal.  Neck: No thyromegaly present.  Cardiovascular: Normal rate and regular rhythm.   Pulmonary/Chest: Effort normal and breath sounds normal.  Abdominal: Soft.  Musculoskeletal: She exhibits no edema.  Neurological: She is alert and oriented to person, place, and time. No cranial nerve deficit.  Psychiatric:       Flat affect PHQ 9: 17          Assessment & Plan:

## 2010-06-11 NOTE — Patient Instructions (Addendum)
Postpartum Depression Depression During and After Pregnancy After delivery, your body is going through a drastic change in hormone levels. You may find yourself crying for no apparent reason and unable to cope with all the changes a new baby brings. This is a common response following a pregnancy. Seek support from your partner and/or friends and just give yourself time to recover. If these feelings persist and you feel you are getting worse, contact your caregiver or other professionals who can help you. WHAT IS DEPRESSION? Depression can be described as feeling sad, blue, unhappy, miserable, or down in the dumps. Most of [place] feel this way at one time or another for short periods. But true clinical depression is a mood disorder in which feelings of sadness, loss, anger, fear, or frustration interfere with everyday life for an extended time. Depression can be mild, moderate, or severe. The degree of depression, which your caregiver can determine, influences your treatment. Postpartum depression occurs within a couple days to months after delivering your baby. HOW COMMON IS DEPRESSION DURING AND AFTER PREGNANCY? Depression that occurs during pregnancy or within a year after delivery is called perinatal depression. Depression after pregnancy is also called postpartum depression or peripartum depression. The exact number of women with depression during this time is unknown, but it occurs in between 10-15% of women. Researchers believe that depression is one of the most common complications during and after pregnancy. The depression is often not recognized or treated, because some normal pregnancy changes cause similar symptoms and are happening at the same time. Tiredness, problems sleeping, stronger emotional reactions, and changes in body weight may occur during and after pregnancy. But these symptoms may also be signs of depression.   WHAT CAUSES DEPRESSION? There may be a number of reasons why a woman gets  depressed.  Rapid hormone changes. Estrogen and progesterone usually decrease immediately after delivering your baby. Researchers think the fast change in hormone levels may lead to depression, just as smaller changes in hormones can affect a woman's moods before she gets her menstrual period.   Decrease in thyroid hormone. Thyroid hormone regulates how your body uses and stores energy from food (metabolism). A simple blood test can tell if this condition is causing a woman's depression. If so, thyroid medicine can be prescribed by your caregiver.   A stressful life event, such as a death in the family. This can cause chemical changes in the brain that lead to depression.   Feeling overwhelmed by caring for and raising a new baby.   Depression is also an illness that runs in some families. It is not always clear what causes depression.  FACTORS THAT MAY INCREASE A WOMAN'S CHANCE OF DEPRESSION DURING PREGNANCY:  History of depression.   Substance abuse, alcohol, or drugs.     Little support from family and friends.     Problems with previous pregnancy or birth.     Young age for motherhood.     Living alone.     Little or no social support.     Family history of mental illness.     Anxiety about the fetus.     Marital or financial problems.     Postpartum depression in a previous pregnancy.     Having a psychiatric illness (schizophrenia, bipolar disorder).     Going through a difficult or stressful pregnancy.     Going through a difficult labor and delivery.     Moving to another city or state during your pregnancy,  or just after delivering your baby.     OTHER FACTORS THAT MAY CONTRIBUTE TO POSTPARTUM DEPRESSION INCLUDE:    Feeling tired after delivery, broken sleep patterns, and not getting enough rest. This often keeps a new mother from regaining her full strength for weeks.   Feeling overwhelmed with a new baby to take care of and doubting your ability to be a good  mother.   Feeling stress from changes in work and home routines. Women sometimes think they need to be "super mom" or perfect. This is not realistic and can add stress.   Having feelings of loss. This can include loss of the identity of who you are, or were, before having the baby, loss of control, loss of your pre-pregnancy figure, and feeling less attractive.   Having less free time and less control over your time. Needing to stay home, indoors, for longer periods of time and having less time to spend with your partner and loved ones can contribute to depression.   Having trouble doing your daily activities at home or at work.   Fears about not knowing how to take of the baby correctly and about harming the baby.   Feelings of guilt that you are not taking care of the baby properly.  SYMPTOMS Any of these symptoms, during and after pregnancy, that last longer than 2 weeks are signs of depression:  Feeling restless or irritable.   Feeling sad, hopeless, and overwhelmed.   Crying a lot.   Having no energy or motivation.   Eating too little or too much.   Sleeping too little or too much.   Trouble focusing, remembering, or making decisions.   Feeling worthless and guilty.   Loss of interest or pleasure in activities.   Withdrawal from friends and family.   Having headaches, chest pains, rapid or irregular heartbeat (palpitations), or fast and shallow breathing (hyperventilation).   After pregnancy, being afraid of hurting the baby or oneself, and not having any interest in the baby.   Not being able to care for yourself or the baby.   Loss of interest in caring for the baby.   Anxiety and panic attacks.   Thoughts of harming yourself, the baby, or someone else.   Feelings of guilt because you feel you are not taking care of the baby well enough.  WHAT IS THE DIFFERENCE BETWEEN "BABY BLUES," POSTPARTUM DEPRESSION, AND POSTPARTUM PSYCHOSIS?  The "baby blues" occurs 70  to 80% of the time, and it can happen in the days right after childbirth. It normally goes away within a few days to a week. A new mother can have sudden mood swings, sadness, crying spells, loss of appetite, sleeping problems, and feel irritable, restless, anxious, and lonely. Symptoms are not severe and treatment usually is not needed. But there are things you can do to feel better. Nap when the baby does. Ask for help from your spouse, family members, and friends. Join a support group of new moms or talk with other moms. If the "baby blues" does not go away in a week to 10 days or gets worse, you may have postpartum depression.   Postpartum depression can happen anytime within the first year after childbirth. A woman may have a number of symptoms, such as sadness, lack of energy, trouble concentrating, anxiety, and feelings of guilt and worthlessness. The difference between postpartum depression and the "baby blues" is that the feelings in postpartum depression are much stronger and often affects a woman's  well-being. It keeps her from functioning well for a longer period of time. Postpartum depression needs to be treated by a caregiver. Counseling, support groups, and medicines can help.   Postpartum psychosis is rare. It occurs in 1 or 2 out of every 1000 births. It usually begins in the first 6 weeks after delivery. Women who have bipolar disorder, schizoaffective disorder, or family history of psychotic disease have a higher risk for developing postpartum psychosis. Symptoms may include delusions, hallucinations, sleep disturbances, and obsessive thoughts about the baby. A woman may have rapid mood swings, from depression, to irritability, to euphoria. This is a serious condition and needs professional care and treatment.  WHAT STEPS CAN I TAKE IF I HAVE SYMPTOMS OF DEPRESSION DURING PREGNANCY OR AFTER CHILDBIRTH?  Some women do not tell anyone about their symptoms, because they feel embarrassed,  ashamed, or guilty about feeling depressed when they are supposed to be happy. They worry that they will be viewed as unfit parents. Perinatal depression can happen to any woman. It does not mean you are a bad or a "not together" mom. You and your baby do not need to suffer. There is help. You should discuss these feelings with your spouse or partner, family, and caregiver.   There are different types of individual and group "talk therapies" that can help a woman with perinatal depression feel better and do better as a mom and as a person. Limited research suggests that many women with perinatal depression improve when treated with antidepressant medicine. Your caregiver can help you learn more about these options and decide which approach is best for you and your baby.   Speak to your caregiver if you are having symptoms of depression while you are pregnant or after you deliver your baby. Your caregiver can give you a questionnaire to test for depression. You can also be referred to a mental health professional who specializes in treating depression.  HOME CARE INSTRUCTIONS  Try to get as much rest as you can. Try to nap when the baby naps.   Stop putting pressure on yourself to do everything. Do as much as you can and leave the rest.   Ask for help with household chores and nighttime feedings. Ask your husband or partner to bring the baby to you, so you can breast-feed. If you can, have a friend, family member, or professional support person help you in the home for part of the day.   Talk to your husband, partner, family, and friends about how you are feeling.   Do not spend a lot of time alone. Get dressed and leave the house. Run an errand or take a short walk.   Spend time alone with your husband or partner.   Talk with other mothers, so you can learn from their experiences.   Join a support group for women with depression. Call a local hotline or look in your telephone book for  information and services.   Do not make any major life changes during pregnancy. Major changes can cause unneeded stress. However, sometimes big changes cannot be avoided. Arrange support and help in your new situation ahead of time.   Exercise regularly.   Eat a balanced and nourishing diet.   Seek help if there are marital or financial problems.   Take the medicine your caregiver gives, as directed.   Keep all your postpartum appointments.  HOW IS DEPRESSION TREATED? There are 2 common types of treatment for depression.  Talk therapy. This  involves talking to a therapist, psychologist, clergyperson, or social worker, in order to learn to change how depression makes you think, feel, and act.   Medicine. Your caregiver can give you an antidepressant medicine to help you. These medicines can help relieve the symptoms of depression.   Women who are pregnant or breast-feeding should talk with their caregivers about the advantages and risks of taking antidepressant medicines. Some women are concerned that taking these medicines may harm the baby. A mother's depression can affect her baby's development. Getting treatment is important for both mother and baby. The risks of taking medicine must be weighed against the risks of depression. It is a decision that women need to discuss carefully with their caregivers. Women who decide to take antidepressant medicines should talk to their caregivers about which antidepressant medicines are safer to take while pregnant or breast-feeding.  What effects can untreated depression have?  Depression not only hurts the mother, but it also affects her family. Some researchers have found that depression during pregnancy can raise the risk of delivering an underweight baby or a premature infant. Some women with depression have difficulty caring for themselves during pregnancy. They may have trouble eating and do not gain enough weight during the pregnancy. They may  also have trouble sleeping, may miss prenatal visits, may not follow medical instructions, have a poor diet, or may use harmful substances, like tobacco, alcohol, or illegal drugs.   Postpartum depression can affect a mother's ability to parent. She may lack energy, have trouble concentrating, be irritable, and not be able to meet her child's needs for love and affection. As a result, she may feel guilty and lose confidence in herself as a mother. This can make the depression worse. Researchers believe that postpartum depression can affect the infant by causing delays in language development, problems with emotional bonding to others, behavioral problems, lower activity levels, sleep problems, and distress. It helps if the father or another caregiver can assist in meeting the needs of the baby, and other children in the family, while the mother is depressed.   All children deserve the chance to have a healthy mom. All moms deserve the chance to enjoy their life and their children. Do not suffer alone. If you are experiencing symptoms of depression during pregnancy or after having a baby, tell a loved one and call your caregiver right away.  SEEK MEDICAL CARE IF:  You think you have postpartum depression.   You want medicine to treat your postpartum depression.   You want a referral to a psychiatrist or psychologist.   You are having a reaction or problems with your medicine.  SEEK IMMEDIATE MEDICAL CARE IF:  You have suicidal feelings.   You feel you may harm the baby.   You feel you may harm your spouse/partner, or someone else.   You feel you need to be admitted to a hospital now.   You feel you are losing control and need treatment immediately.  FOR MORE INFORMATION You can find out more about depression during and after pregnancy by contacting the [facility]) at [phone] or the following organizations:  [employer],  [place], [place]  Phone: 7818813425  Internet Address: WebCheating.is     American Psychological Association  Phone: 504-187-9709  Internet Address: TennisConsultants.fi     Postpartum Education for Parents  Phone: 7855825011  Internet Address: http://www.sbpep.[facility] [employer] [facility] Information [facility], [employer], [place]  Phone: 801-485-3642 Internet Address: http://www.mentalhealth.org     National Mental Health Association Phone: (800) 969-NMHA Internet Address: http://www.nmha.org     Postpartum Support International Phone: 226-098-9623 Internet Address: http://www.postpartum.net     Document Released: 11/01/2003 Document Re-Released: 07/17/2009 [facility] Patient Information 2011 [place], [place].   Please schedule a follow up appointment in 1-2 weeks  Call and schedule an appointment for next available with Dr. Pascal Lux

## 2010-06-25 ENCOUNTER — Ambulatory Visit: Payer: Medicaid Other | Admitting: Family Medicine

## 2010-07-23 ENCOUNTER — Ambulatory Visit (INDEPENDENT_AMBULATORY_CARE_PROVIDER_SITE_OTHER): Payer: BC Managed Care – PPO | Admitting: Family Medicine

## 2010-07-23 ENCOUNTER — Encounter: Payer: Self-pay | Admitting: Family Medicine

## 2010-07-23 VITALS — BP 111/72 | HR 82 | Temp 97.8°F | Ht 68.0 in | Wt 133.0 lb

## 2010-07-23 DIAGNOSIS — Z309 Encounter for contraceptive management, unspecified: Secondary | ICD-10-CM

## 2010-07-25 NOTE — Progress Notes (Signed)
Pt was scheduled for IUD insertion, but actually desired Nexaplon. Rescheduled with a provider that does Nexaplon insertions.

## 2010-08-08 ENCOUNTER — Ambulatory Visit (INDEPENDENT_AMBULATORY_CARE_PROVIDER_SITE_OTHER): Payer: BC Managed Care – PPO | Admitting: Family Medicine

## 2010-08-08 DIAGNOSIS — Z309 Encounter for contraceptive management, unspecified: Secondary | ICD-10-CM

## 2010-08-08 DIAGNOSIS — Z3046 Encounter for surveillance of implantable subdermal contraceptive: Secondary | ICD-10-CM

## 2010-08-08 DIAGNOSIS — Z30017 Encounter for initial prescription of implantable subdermal contraceptive: Secondary | ICD-10-CM

## 2010-08-08 LAB — POCT URINE PREGNANCY: Preg Test, Ur: NEGATIVE

## 2010-08-12 MED ORDER — ETONOGESTREL 68 MG ~~LOC~~ IMPL
68.0000 mg | DRUG_IMPLANT | Freq: Once | SUBCUTANEOUS | Status: AC
  Administered 2010-08-12: 68 mg via SUBCUTANEOUS

## 2010-08-13 NOTE — Progress Notes (Signed)
  Subjective:    Patient ID: Alexandra Short, female    DOB: 1990/12/07, 20 y.o.   MRN: [id number]  HPI  Here for explanon insertion. Has no questions about the procedure or the device.  Review of Systems No fever.    Objective:   Physical Exam    GENERAL: Well-developed female no acute distress PROCEDURE NOTE: Explanon insertion Patient given informed consent, signed copy in the chart.  Appropriate time out taken  Pregnancy test was negative.  The patient's  left  arm was prepped and draped in the usual sterile fashion.. The ruler used to measure and mark the insertion area 8 cm from medial epicondyle of the elbow. Local anaesthesia obtained using 1.5 cc of 1% lidocaine with epinephrine. Explanon was inserted per manufacturer's directions. Less than 3 cc blood loss. The insertion site covered with antibiotic ointment and a pressure bandage to minimize bruising. There were no complications and the patient tolerated the procedure well.  Device information was given in handout form. Patient is informed the removal date will be in three years.     Assessment & Plan:  explanon insertion

## 2010-08-16 ENCOUNTER — Telehealth: Payer: Self-pay | Admitting: Family Medicine

## 2010-08-16 NOTE — Telephone Encounter (Signed)
Opened in ERROR

## 2010-11-12 LAB — POCT URINALYSIS DIP (DEVICE)
Glucose, UA: NEGATIVE
Nitrite: NEGATIVE
pH: 6

## 2010-11-12 LAB — WET PREP, GENITAL: Yeast Wet Prep HPF POC: NONE SEEN

## 2010-11-12 LAB — POCT PREGNANCY, URINE: Preg Test, Ur: NEGATIVE

## 2010-11-12 LAB — GC/CHLAMYDIA PROBE AMP, GENITAL: Chlamydia, DNA Probe: NEGATIVE

## 2011-04-21 IMAGING — US US OB DETAIL+14 WK
2 series · 14 of 28 positions shown · non-contrast
Comparison: none

OBSTETRICAL ULTRASOUND:
 This ultrasound exam was performed in the [HOSPITAL] Ultrasound Department.  The OB US report was generated in the AS system, and faxed to the ordering physician.  This report is also available in [REDACTED] PACS.

[Series 1: us ob detail +14 wk · 13 of 66 slices shown (1 of 2)]
[im 3/66]
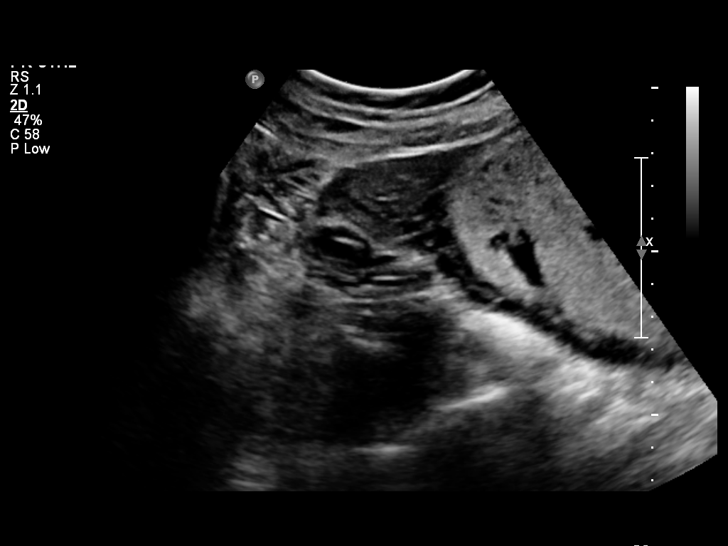
[im 8/66]
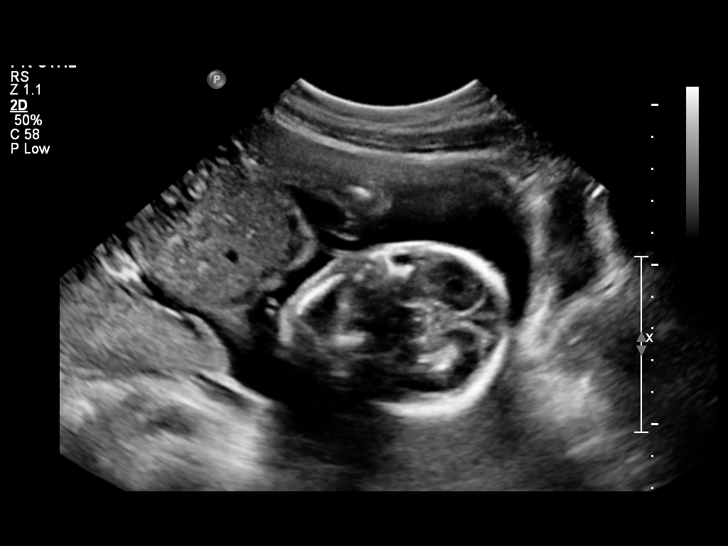
[im 14/66]
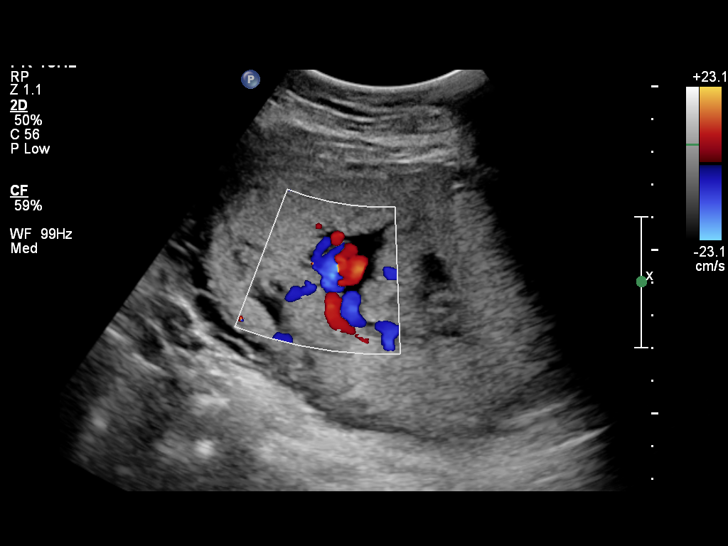
[im 19/66]
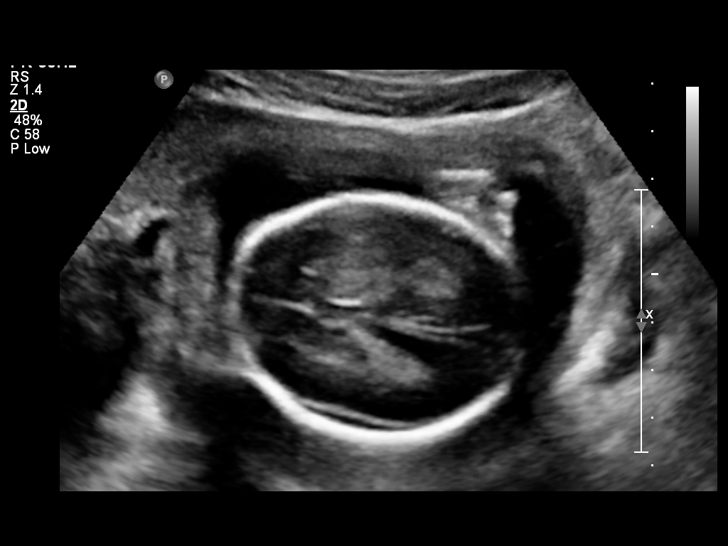
[im 24/66]
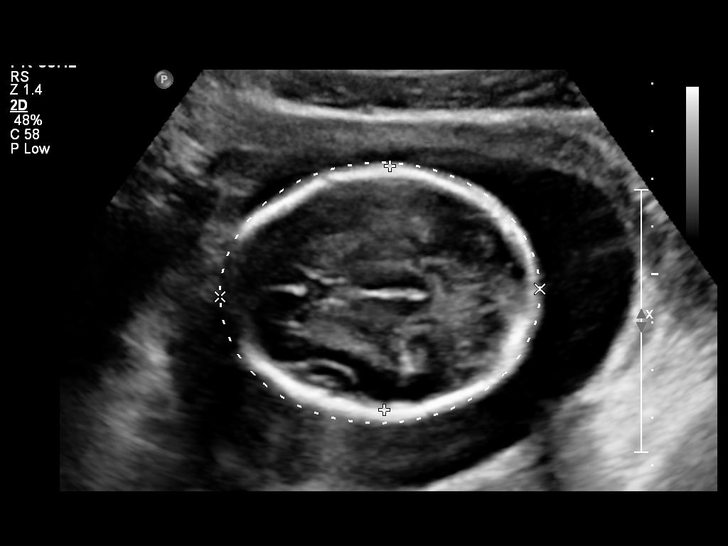
[im 29/66]
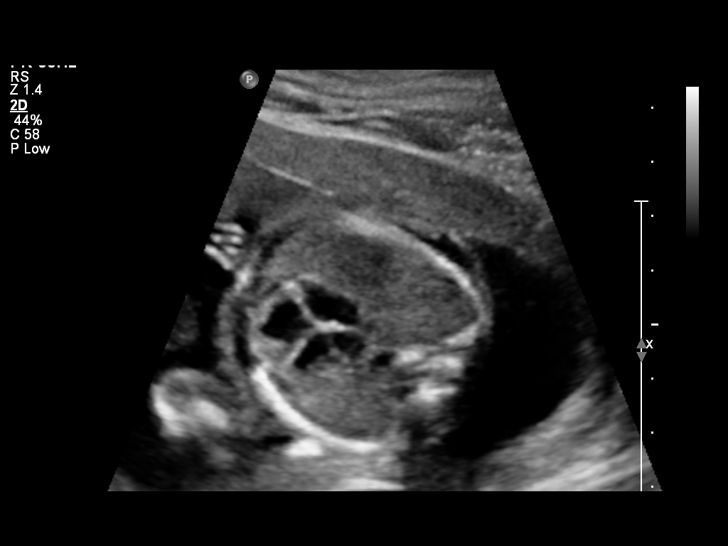
[im 34/66]
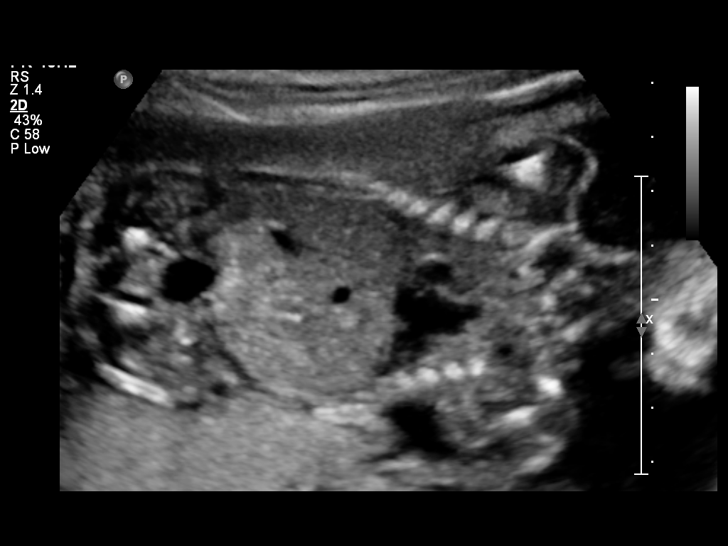
[im 40/66]
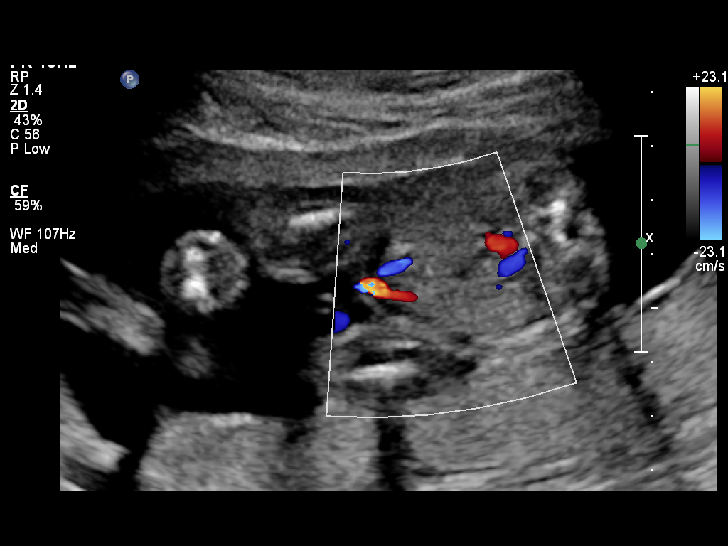
[im 45/66]
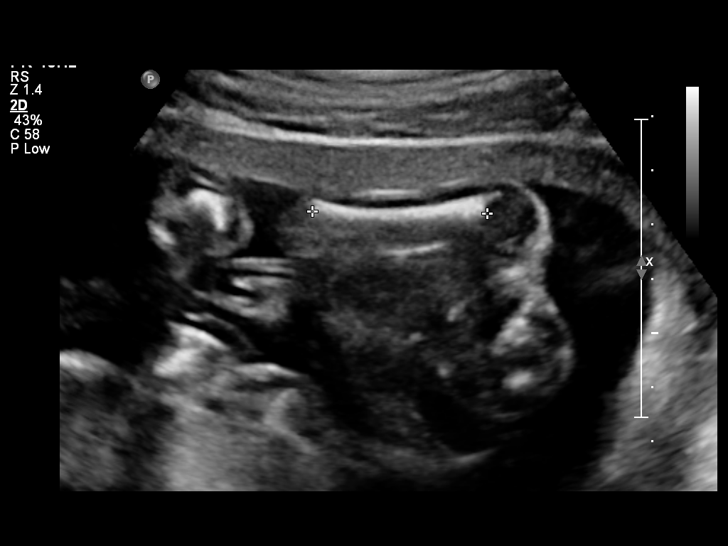
[im 50/66]
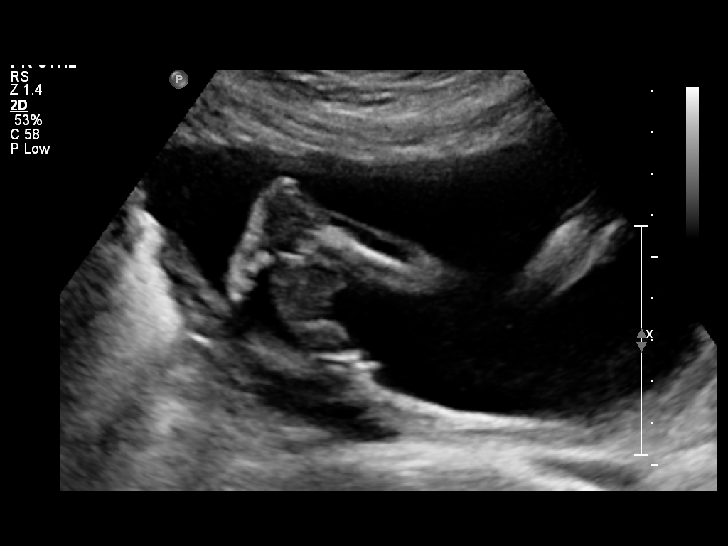
[im 55/66]
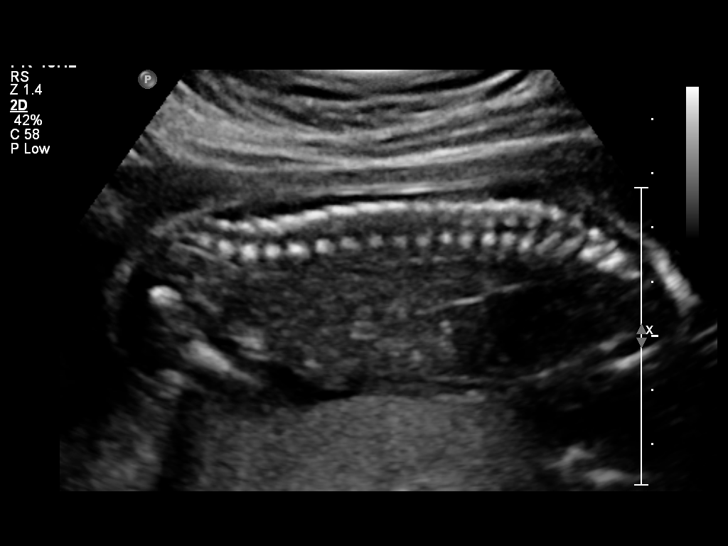
[im 60/66]
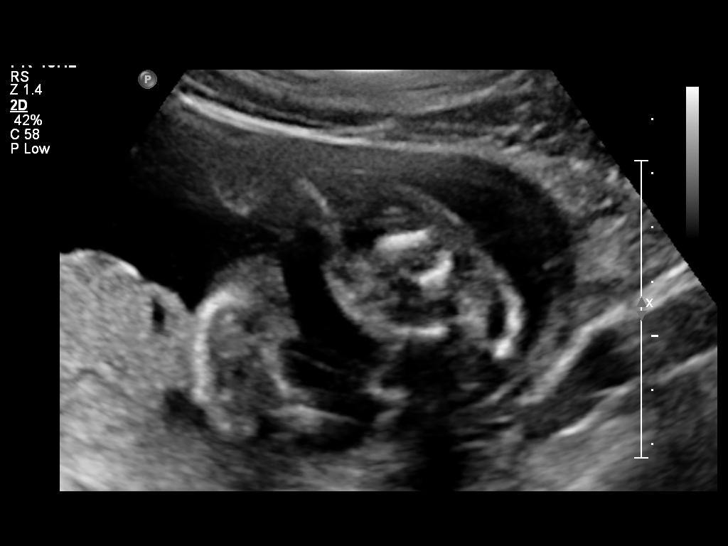
[im 66/66]
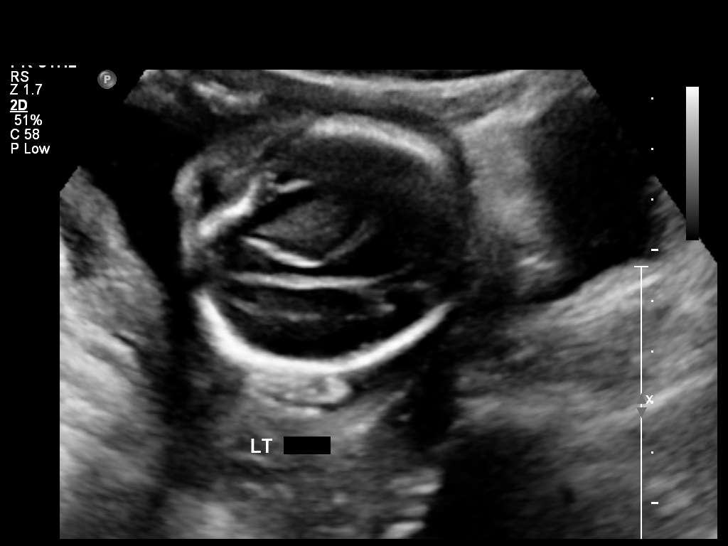

[Series 1: us ob detail +14 wk · 1 of 4 slices shown (2 of 2)]
[im 4/4]
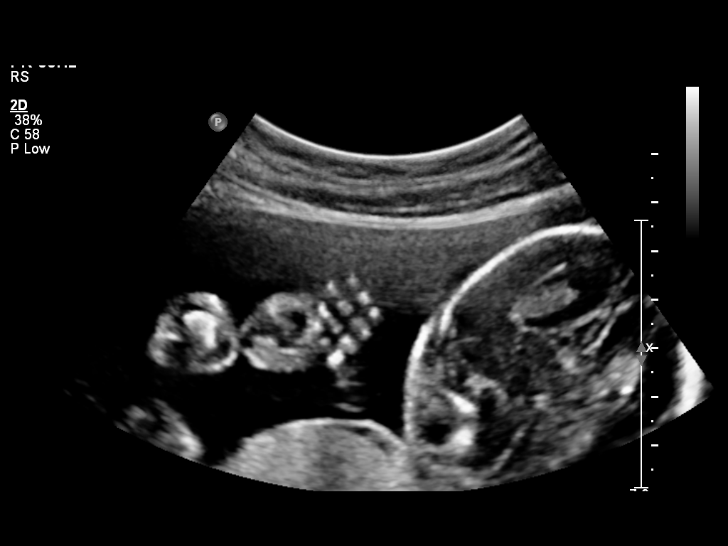

[14 of 28 positions shown; findings below may reference images not displayed]

IMPRESSION: See AS Obstetric US report.

## 2011-08-21 IMAGING — US US OB FOLLOW-UP
2 series · 14 of 23 positions shown · non-contrast
Comparison: none

OBSTETRICAL ULTRASOUND:
 This ultrasound exam was performed in the [HOSPITAL] Ultrasound Department.  The OB US report was generated in the AS system, and faxed to the ordering physician.  This report is also available in [HOSPITAL]?s [employer] and in [REDACTED] PACS.

[Series 1: us ob follow up · 0.22mm/px · 4 of 6 slices shown (1 of 2)]
[im 1/6]
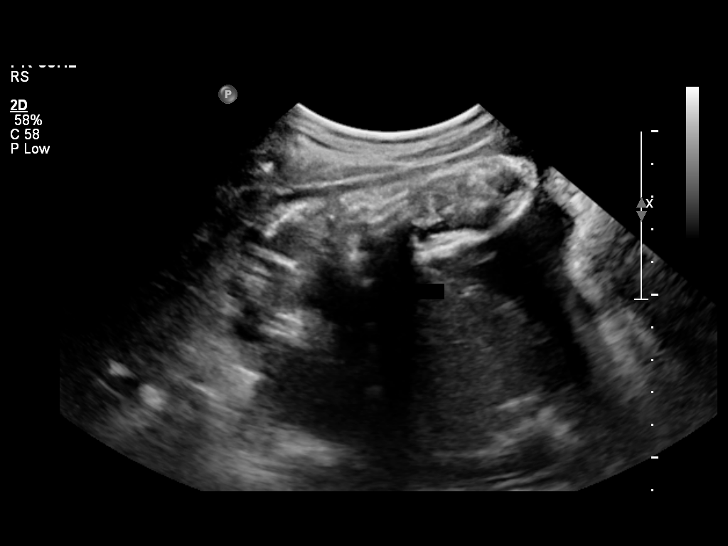
[im 3/6]
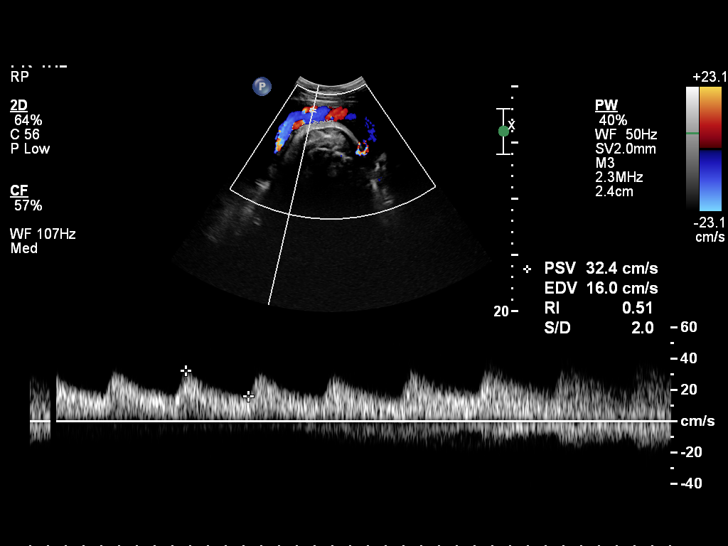
[im 5/6]
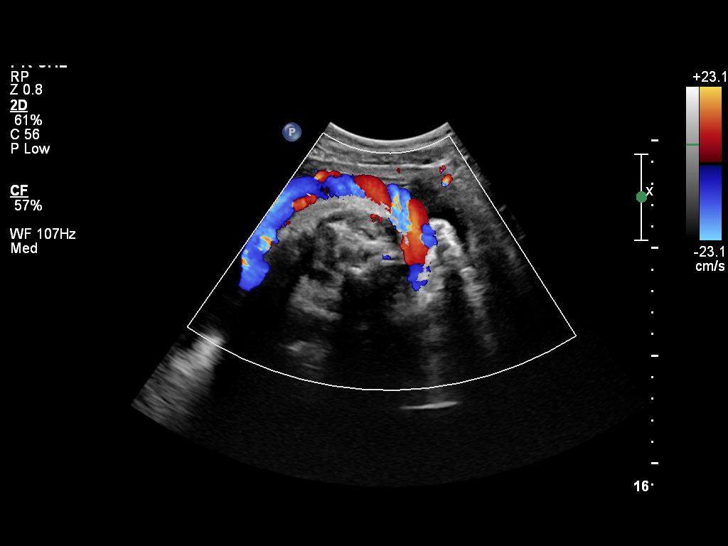
[im 6/6]
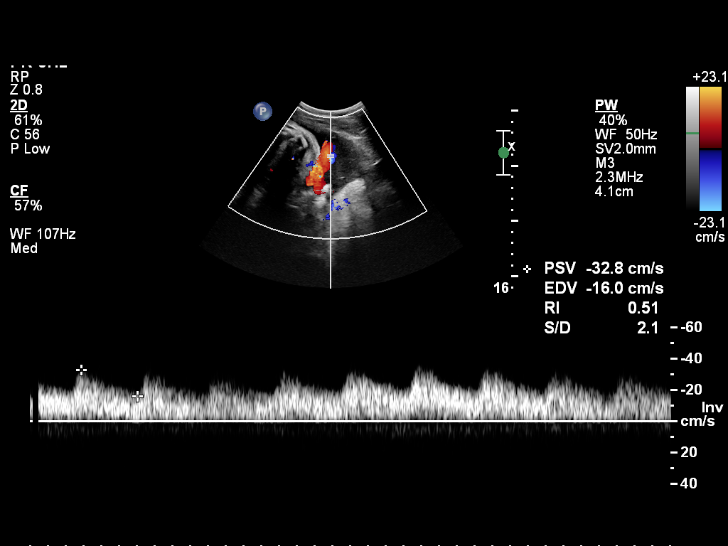

[Series 1: us ob follow up · 10 of 17 slices shown (2 of 2)]
[im 2/17]
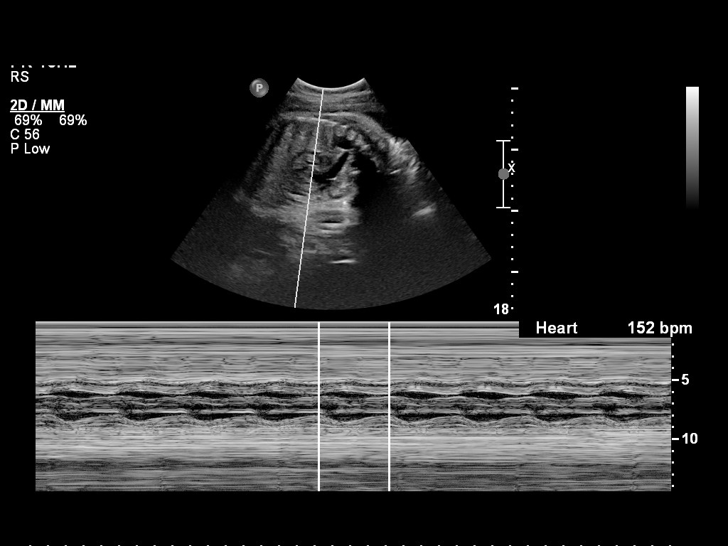
[im 4/17]
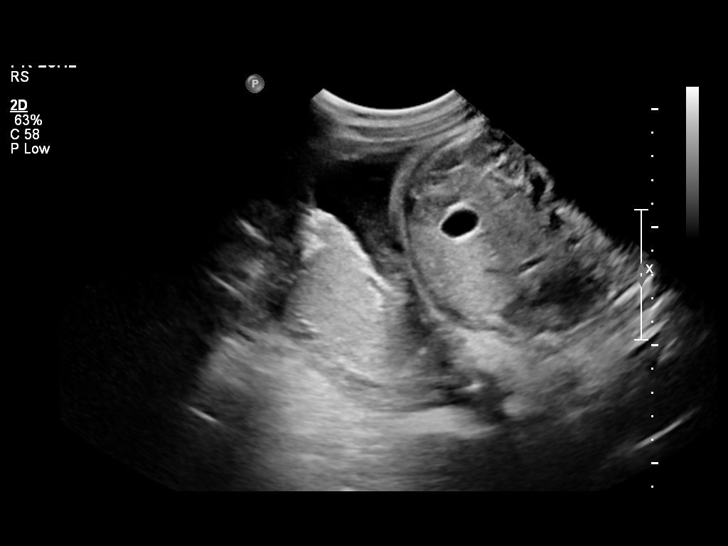
[im 5/17]
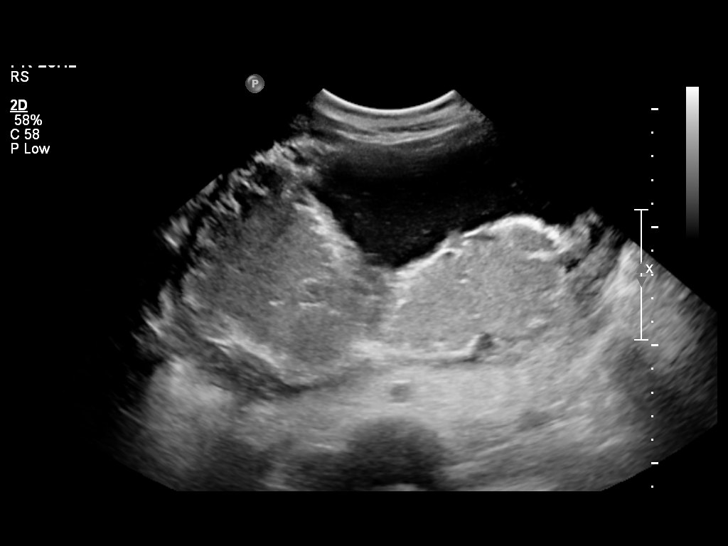
[im 7/17]
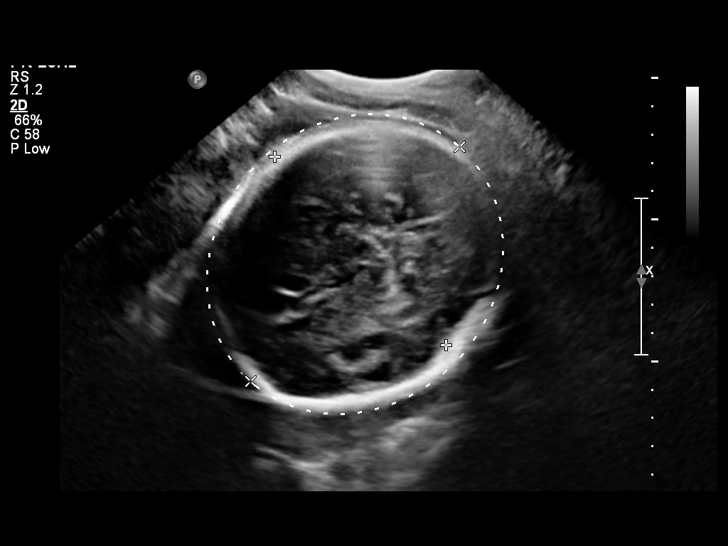
[im 8/17]
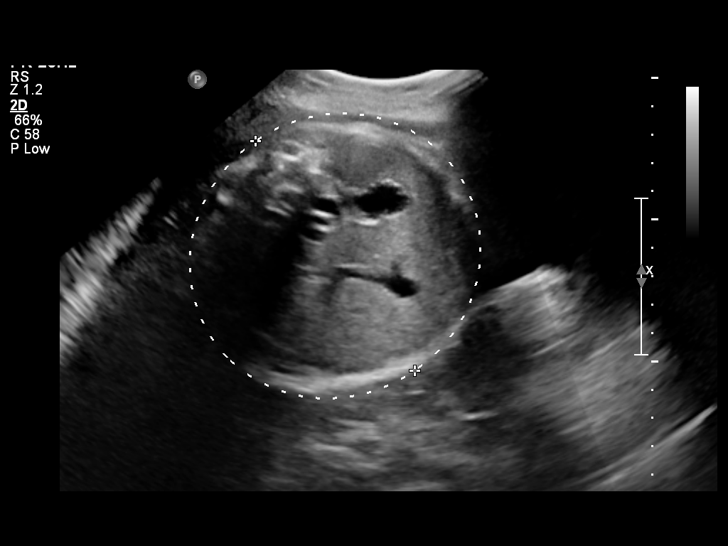
[im 10/17]
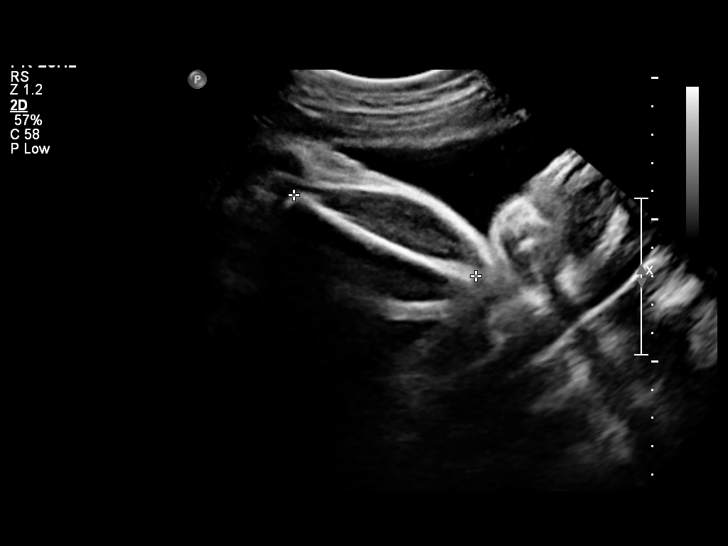
[im 12/17]
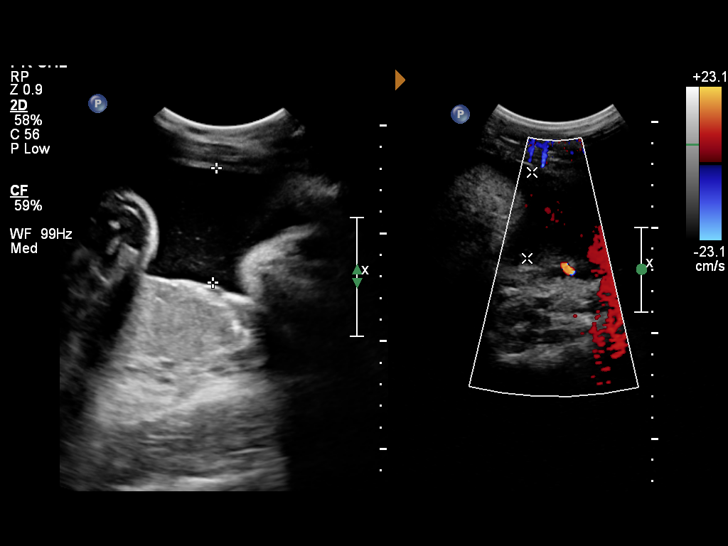
[im 13/17]
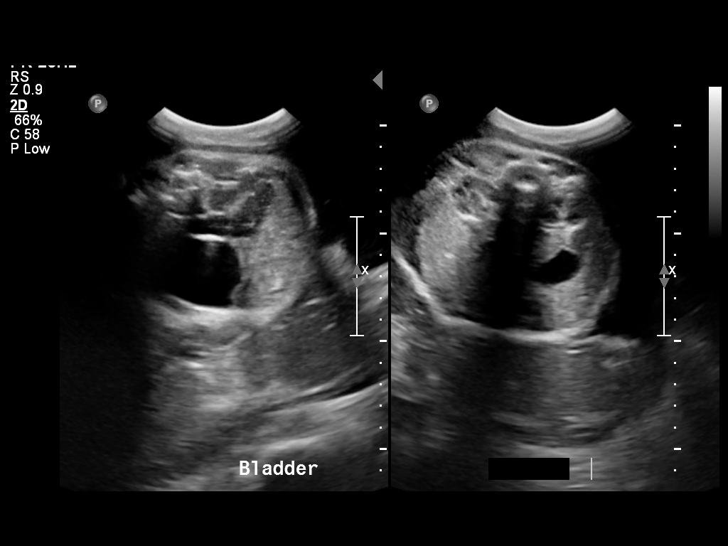
[im 15/17]
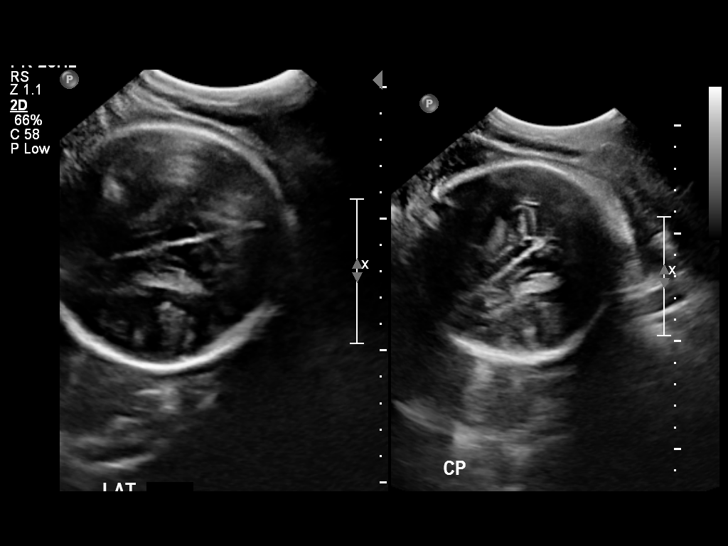
[im 17/17]
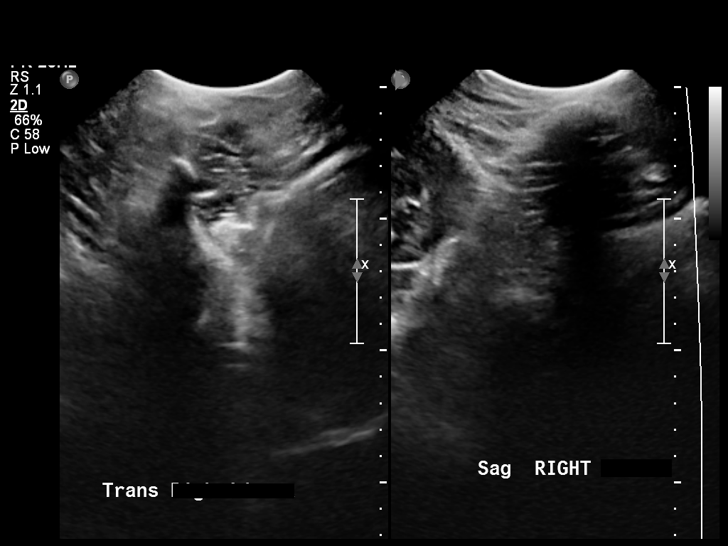

[14 of 23 positions shown; findings below may reference images not displayed]

IMPRESSION: See AS Obstetric US report.

## 2012-08-26 ENCOUNTER — Encounter: Payer: Self-pay | Admitting: Family Medicine

## 2013-08-26 ENCOUNTER — Ambulatory Visit (INDEPENDENT_AMBULATORY_CARE_PROVIDER_SITE_OTHER): Payer: BLUE CROSS/BLUE SHIELD | Admitting: Family Medicine

## 2013-08-26 ENCOUNTER — Encounter: Payer: Self-pay | Admitting: Family Medicine

## 2013-08-26 VITALS — BP 125/84 | HR 79 | Temp 98.3°F | Wt 165.0 lb

## 2013-08-26 DIAGNOSIS — Z309 Encounter for contraceptive management, unspecified: Secondary | ICD-10-CM

## 2013-08-26 MED ORDER — NORGESTIMATE-ETH ESTRADIOL 0.25-35 MG-MCG PO TABS: 1.0000 | ORAL_TABLET | Freq: Every day | ORAL | Status: DC

## 2013-08-26 NOTE — Patient Instructions (Signed)
Nice to meet you. We removed your nexplanon implant today. Please leave the pressure dressing on for the next 24 hours. Please leave the strip on for 3 days.  If you develop any redness or pain at the site please let know.   I have written a prescription for birth control pills. It is important that you take these every day at the same time to get their full effectiveness.

## 2013-08-26 NOTE — Assessment & Plan Note (Signed)
Implanon removed today without complications. Discussed post-procedure care and return precautions. Tylenol for discomfort. Will start on sprintec for OCP contraception moving forward. Discussed smoking cessation as this and OCP use in combination place the patient at increased risk for blood clots. She voiced understanding of this and choose to proceed with OCPs.

## 2013-08-26 NOTE — Progress Notes (Signed)
Patient ID: Alexandra Short, female   DOB: 04/13/1990, 23 y.o.   MRN: [id number]  Marikay AlarEric Corrinne Benegas, MD Phone: [phone]  Alexandra Short is a 23 y.o. female who presents today for implanon removal.  Patient reports that it is time for implanon to be removed. She notes she has had this in place for 3 years. She notes she did not like the irregular bleeding since she's had this in place. She would like to go on OCPs. She denies history of blood clots in herself and her family. She does smoke 1/2 PPD cigarettes. She has not previously used OCPs. She has previously used depo.  Patient is a smoker.   ROS: Per HPI   Physical Exam Filed Vitals:   08/26/13 1119  BP: 125/84  Pulse: 79  Temp: 98.3 F (36.8 C)    Gen: Well NAD Skin: left inner arm with implanon palpated and confirmed location, scattered tattoos, no rashes  PROCEDURE NOTE: Implanon Removal Patient given informed consent for removal of her implanon.  Signed copy in the chart.  Appropriate time out taken.  Implanon site identified.  Area prepped in usual sterile fashon. One cc of 1% lidocaine was used to anesthetize the area at the distal end of the implant. A small stab incision was made right beside the implant on the distal portion. The implanon rod was grasped using hemostats and removed. Note there was a fibrous capsule around the implant and this was dissected away with no complications.  There was less than 3 cc blood loss. There were no complications.  A small amount of antibiotic ointment and steri-strips were applied over the small incision.  A pressure bandage was applied to reduce any bruising.  The patient tolerated the procedure well and was given post procedure instructions.   Assessment/Plan: Please see individual problem list.  # Healthcare maintenance: needs to return for a pap smear

## 2013-12-12 ENCOUNTER — Encounter: Payer: Self-pay | Admitting: Family Medicine

## 2015-01-17 ENCOUNTER — Encounter: Payer: Self-pay | Admitting: Family Medicine

## 2015-02-11 ENCOUNTER — Emergency Department (HOSPITAL_COMMUNITY)
Admission: EM | Admit: 2015-02-11 | Discharge: 2015-02-11 | Disposition: A | Discharge status: Home / Self Care | Payer: Medicaid Other | Attending: Emergency Medicine | Admitting: Emergency Medicine

## 2015-02-11 ENCOUNTER — Encounter (HOSPITAL_COMMUNITY): Payer: Self-pay | Admitting: *Deleted

## 2015-02-11 ENCOUNTER — Emergency Department (HOSPITAL_COMMUNITY): Payer: Medicaid Other

## 2015-02-11 DIAGNOSIS — N611 Abscess of the breast and nipple: Secondary | ICD-10-CM | POA: Diagnosis not present

## 2015-02-11 DIAGNOSIS — N644 Mastodynia: Secondary | ICD-10-CM

## 2015-02-11 DIAGNOSIS — L089 Local infection of the skin and subcutaneous tissue, unspecified: Secondary | ICD-10-CM | POA: Diagnosis present

## 2015-02-11 DIAGNOSIS — F17200 Nicotine dependence, unspecified, uncomplicated: Secondary | ICD-10-CM | POA: Diagnosis not present

## 2015-02-11 DIAGNOSIS — Z79899 Other long term (current) drug therapy: Secondary | ICD-10-CM | POA: Diagnosis not present

## 2015-02-11 MED ORDER — SULFAMETHOXAZOLE-TRIMETHOPRIM 800-160 MG PO TABS: 1.0000 | ORAL_TABLET | Freq: Two times a day (BID) | ORAL | Status: AC

## 2015-02-11 NOTE — ED Notes (Signed)
See PA Assessment  

## 2015-02-11 NOTE — ED Provider Notes (Signed)
CSN: [id number]     Arrival date & time 02/11/15  [id number] History  By signing my name below, I, Elon SpannerGarrett Cook, attest that this documentation has been prepared under the direction and in the presence of [facility] Hospital[facility]ime Pilcher Ward, PA-C. Electronically Signed: Elon SpannerGarrett Cook ED Scribe. 02/11/2015. 9:05 AM.    Chief Complaint  Patient presents with  . Skin Problem   The history is provided by the patient. No language interpreter was used.   HPI Comments: Alexandra Short is a 25 y.o. female who presents to the Emergency Department complaining of a gradually increasing in size, non-draining knot on the left breast first observed 7-8 days ago and becoming moderately painful 3 days ago.  She describes the pain is an intermittent pressure/throbbing sensation and it is worse with touch.  Patient is not breast feeding.  She denies family hx of breast cancer.  She denies fever, abdominal pain, other complaints.  LNMP: 11/?/2016  OB/GYN: none  Past Medical History  Diagnosis Date  . Postpartum depression 06/11/2010   History reviewed. No pertinent past surgical history. Family History  Problem Relation Age of Onset  . Hypertension Mother   . Diabetes Father   . Diabetes Maternal Aunt   . Diabetes Maternal Uncle    Social History  Substance Use Topics  . Smoking status: Current Some Day Smoker -- 0.25 packs/day  . Smokeless tobacco: None  . Alcohol Use: No   OB History    Gravida Para Term Preterm AB TAB SAB Ectopic Multiple Living   2 2 2  0 0 0 0 0 0 2     Review of Systems 10 Systems reviewed and all are negative for acute change except as noted in the HPI.   Allergies  Review of patient's allergies indicates no known allergies.  Home Medications   Prior to Admission medications   Medication Sig Start Date End Date Taking? Authorizing Provider  norgestimate-ethinyl estradiol (ORTHO-CYCLEN,SPRINTEC,PREVIFEM) 0.25-35 MG-MCG tablet Take 1 tablet by mouth daily. 08/26/13  Yes Glori LuisEric G Sonnenberg, MD   Prenatal Vitamins (CALNA) TABS Take 1 tablet by mouth daily.    Yes Historical Provider, MD  sertraline (ZOLOFT) 50 MG tablet Take 1 tablet (50 mg total) by mouth daily. 06/11/10 06/11/11  Jonnie KindWeston W Saunders, MD  sulfamethoxazole-trimethoprim (BACTRIM DS,SEPTRA DS) 800-160 MG tablet Take 1 tablet by mouth 2 (two) times daily. 02/11/15 02/18/15  Jaime Pilcher Ward, PA-C   BP 127/59 mmHg  Pulse 62  Temp(Src) 98 F (36.7 C) (Oral)  Resp 16  Ht 5\' 9"  (1.753 m)  Wt 79.833 kg  BMI 25.98 kg/m2  SpO2 100%  LMP 01/10/2015 Physical Exam  Constitutional: She is oriented to person, place, and time. She appears well-developed and well-nourished. No distress.  HENT:  Head: Normocephalic and atraumatic.  Neck: Normal range of motion. Neck supple.  Cardiovascular: Normal rate, regular rhythm and normal heart sounds.  Exam reveals no gallop and no friction rub.   No murmur heard. Pulmonary/Chest: Effort normal and breath sounds normal. No respiratory distress. She has no wheezes. She has no rales.  Musculoskeletal: Normal range of motion.  Lymphadenopathy:    She has no cervical adenopathy.  Neurological: She is alert and oriented to person, place, and time.  Skin: Skin is warm and dry.  Breast with TTP of nipple- 3 firm nodule directly under nipple; erythema of superior skin surrounding nipple. No drainage.   Psychiatric: She has a normal mood and affect. Her behavior is normal.  Nursing note and  vitals reviewed.   ED Course  Procedures (including critical care time)  DIAGNOSTIC STUDIES: Oxygen Saturation is 100% on RA, normal by my interpretation.    COORDINATION OF CARE:  9:10 AM Discussed treatment plan with patient at bedside.  Patient acknowledges and agrees with plan.    Labs Review Labs Reviewed - No data to display  Imaging Review US Breast Ltd Uni Left Inc Axilla  02/11/2015  CLINICAL DATA:  Left breast pain. Palpable lump near the nipple. Reportedly there is a 3 cm firm lump in the  retroareolar region of the left breast. EXAM: ULTRASOUND OF THE LEFT BREAST COMPARISON:  None FINDINGS: Targeted ultrasound is performed, showing hypoechoic ill-defined mass. This measures 1.3 x 1.4 x 1.5 cm. Within the 6 o'clock location 2 cm from the nipple, incidental note is made of a simple cyst which measures 0.9 x 0.6 x 0.8 cm. IMPRESSION: Irregular complex hypoechoic area in the retroareolar region of the left breast corresponding to the palpable abnormality. Findings consistent with retroareolar breast abscess. Following antibiotic therapy, followup ultrasound is recommended to document complete resolution of this process. Exam was performed from the emergency department. Physical exam and physician directed exam were not performed. RECOMMENDATION: Treatment plan is recommended. Electronically Signed   By: Norva Pavlov M.D.   On: 02/11/2015 11:46   I have personally reviewed and evaluated these images and lab results as part of my medical decision-making.   EKG Interpretation None      MDM   Final diagnoses:  Breast pain  Breast abscess    Alexandra Short presents with right breast tenderness. Discussed patient with radiologist and we agreed ultrasound imaging here in the ED is warranted.   Imaging: U/S shows findings c/w retroareolar breast abscess  A&P: Breast abscess  - Referral to general surgery  - Bactrim  - Return precautions and importance of follow up stressed. Pt. Expresses understanding.   Patient discussed with Dr. Juleen China who agrees with treatment plan.   I personally performed the services described in this documentation, which was scribed in my presence. The recorded information has been reviewed and is accurate.  [facility] Ward, PA-C 02/11/15 1215  Raeford Razor, MD 02/12/15 [id number]

## 2015-02-11 NOTE — ED Notes (Signed)
Pt reports bump on left breast one week ago. Pt reports area is under LT nipple. Pain is 58/10. Pt reports she has not observed any drainage.

## 2015-02-11 NOTE — Discharge Instructions (Signed)
Follow up with the surgery clinic listed above. Call them in the morning and make an appointment. Let them know you were seen in the ER and told to call.  Please take all of your antibiotics until finished! Return to ED for any new or worsening symptoms, any additional concerns.

## 2015-02-11 NOTE — ED Notes (Signed)
Declined W/C at D/C and was escorted to lobby by RN. 

## 2015-04-17 ENCOUNTER — Emergency Department (HOSPITAL_COMMUNITY): Payer: Medicaid Other

## 2015-04-17 ENCOUNTER — Encounter (HOSPITAL_COMMUNITY): Payer: Self-pay | Admitting: Emergency Medicine

## 2015-04-17 ENCOUNTER — Emergency Department (HOSPITAL_COMMUNITY)
Admission: EM | Admit: 2015-04-17 | Discharge: 2015-04-17 | Disposition: A | Discharge status: Home / Self Care | Payer: Medicaid Other | Attending: Emergency Medicine | Admitting: Emergency Medicine

## 2015-04-17 DIAGNOSIS — F329 Major depressive disorder, single episode, unspecified: Secondary | ICD-10-CM | POA: Diagnosis not present

## 2015-04-17 DIAGNOSIS — S0511XA Contusion of eyeball and orbital tissues, right eye, initial encounter: Secondary | ICD-10-CM | POA: Diagnosis not present

## 2015-04-17 DIAGNOSIS — Z79899 Other long term (current) drug therapy: Secondary | ICD-10-CM | POA: Diagnosis not present

## 2015-04-17 DIAGNOSIS — Y9389 Activity, other specified: Secondary | ICD-10-CM | POA: Diagnosis not present

## 2015-04-17 DIAGNOSIS — Y999 Unspecified external cause status: Secondary | ICD-10-CM | POA: Insufficient documentation

## 2015-04-17 DIAGNOSIS — S199XXA Unspecified injury of neck, initial encounter: Secondary | ICD-10-CM | POA: Diagnosis present

## 2015-04-17 DIAGNOSIS — Y9289 Other specified places as the place of occurrence of the external cause: Secondary | ICD-10-CM | POA: Diagnosis not present

## 2015-04-17 DIAGNOSIS — F17200 Nicotine dependence, unspecified, uncomplicated: Secondary | ICD-10-CM | POA: Diagnosis not present

## 2015-04-17 DIAGNOSIS — S43402A Unspecified sprain of left shoulder joint, initial encounter: Secondary | ICD-10-CM | POA: Diagnosis not present

## 2015-04-17 DIAGNOSIS — R52 Pain, unspecified: Secondary | ICD-10-CM

## 2015-04-17 DIAGNOSIS — S0011XA Contusion of right eyelid and periocular area, initial encounter: Secondary | ICD-10-CM

## 2015-04-17 DIAGNOSIS — S161XXA Strain of muscle, fascia and tendon at neck level, initial encounter: Secondary | ICD-10-CM | POA: Insufficient documentation

## 2015-04-17 DIAGNOSIS — R040 Epistaxis: Secondary | ICD-10-CM

## 2015-04-17 MED ORDER — ACETAMINOPHEN 325 MG PO TABS
975.0000 mg | ORAL_TABLET | Freq: Once | ORAL | Status: AC
  Administered 2015-04-17: 975 mg via ORAL
  Filled 2015-04-17: qty 3

## 2015-04-17 MED ORDER — HYDROCODONE-ACETAMINOPHEN 5-325 MG PO TABS: ORAL_TABLET | ORAL | Status: DC

## 2015-04-17 NOTE — Discharge Instructions (Signed)
For pain control please take ibuprofen (also known as Motrin or Advil) 800mg  (this is normally 4 over the counter pills) 3 times a day  for 5 days. Take with food to minimize stomach irritation.  Take vicodin for breakthrough pain, do not drink alcohol, drive, care for children or do other critical tasks while taking vicodin.  Do not hesitate to return to the emergency room for any new, worsening or concerning symptoms.  Please obtain primary care using resource guide below. Let them know that you were seen in the emergency room and that they will need to obtain records for further outpatient management.   Eye Contusion An eye contusion is a deep bruise of the eye. This is often called a "black eye." Contusions are the result of an injury that caused bleeding under the skin. The contusion may turn blue, purple, or yellow. Minor injuries will give you a painless contusion, but more severe contusions may stay painful and swollen for a few weeks. If the eye contusion only involves the eyelids and tissues around the eye, the injured area will get better within a few days to weeks. However, eye contusions can be serious and affect the eyeball and sight. CAUSES   Blunt injury or trauma to the face or eye area.  A forehead injury that causes the blood under the skin to work its way down to the eyelids.  Rubbing the eyes due to irritation. SYMPTOMS   Swelling and redness around the eye.  Bruising around the eye.  Tenderness, soreness, or pain around the eye.  Blurry vision.  Tearing.  Eyeball redness. DIAGNOSIS  A diagnosis is usually based on a thorough exam of the eye and surrounding area. The eye must be looked at carefully to make sure it is not injured and to make sure nothing else will threaten your vision. A vision test may be done. An X-ray or computed tomography (CT) scan may be needed to determine if there are any associated injuries, such as broken bones (fractures). TREATMENT    If there is an injury to the eye, treatment will be determined by the nature of the injury. HOME CARE INSTRUCTIONS   Put ice on the injured area.  Put ice in a plastic bag.  Place a towel between your skin and the bag.  Leave the ice on for 15-20 minutes, 03-04 times a day.  If it is determined that there is no injury to the eye, you may continue normal activities.  Sunglasses may be worn to protect your eyes from bright light if light is uncomfortable.  Sleep with your head elevated. You can put an extra pillow under your head. This may help with discomfort.  Only take over-the-counter or prescription medicines for pain, discomfort, or fever as directed by your caregiver. Do not take aspirin for the first few days. This may increase bruising. SEEK IMMEDIATE MEDICAL CARE IF:   You have any form of vision loss.  You have double vision.  You feel nauseous.  You feel dizzy, sleepy, or like you will faint.  You have any fluid discharge from the eye or your nose.  You have swelling and discoloration that does not fade. MAKE SURE YOU:   Understand these instructions.  Will watch your condition.  Will get help right away if you are not doing well or get worse.   This information is not intended to replace advice given to you by your health care provider. Make sure you discuss any questions you  have with your health care provider.   Document Released: 01/25/2000 Document Revised: 04/21/2011 Document Reviewed: 10/03/2014 Elsevier Interactive Patient Education 2016 [employer]. '  [employer] Guide Financial Assistance The [employer] 211 is a great source of information about community services available.  Access by dialing 2-1-1 from anywhere in [place], or by website -  PooledIncome.pl.   Other Local Resources (Updated 02/2015)  Financial Assistance   Services    Phone Number and Address  [facility]  Low-cost medical care - 1st and  3rd Saturday of every month  Must not qualify for public or private insurance and must have limited income [phone] [address] [place], [place]    [place] [employer] of Social Services  Child care  Emergency assistance for housing and [employer]  Medicaid [id number] [address] [place], [place] [address]   [facility]  Low-cost medical care for children, communicable diseases, sexually-transmitted diseases, immunizations, maternity care, womens health and family planning [phone] [address] [place], [place] [address]  [facility]   Medication assistance for [facility] residents  Must meet income requirements [phone] [address] [place], [place].    [facility] Social Services  Child care  Emergency assistance for housing and [employer]  Medicaid [phone] [address] [place], [place] [address]  [facility]   Low-cost medical care,   Monday through Friday, 9 am to 6 pm.   Accepts Medicare/Medicaid, and self-pay [phone] [address] [place], [place] [address]  [facility]  Low-cost medical care - Monday through Friday, 8:30 am - 5:30 pm  Accepts Medicaid and self-pay [phone] [address] [place], [place] [address]   [facility] [place] [facility]  Primary medical care, including for those with sickle cell disease  Accepts Medicare, Medicaid, insurance and self-pay [phone] [address] [place], [place]  [facility]   Primary medical care  Accepts Medicare, [place], insurance and self-pay [phone] [address] [place], [place] [address]   [facility] Department of Social Services  Child care  Emergency assistance for housing and [employer]  Medicaid  [phone] [address] [place], [place] [address]  [facility] Department of Health and [employer]  Child care  Emergency assistance for housing and [employer]  Medicaid [phone] [address] [place], [place] [address]   [facility] Medication Assistance Program  Medication assistance for [facility] residents with no insurance only  Must have a primary care doctor [phone] [address] [place], [place]  [facility]   Primary medical care  [place], [place], insurance  [phone] [address] [place], [place]  [facility]   Medication assistance [phone]  [facility]   Primary medical care  Accepts Medicare, [place], insurance and self-pay [phone] [place], [place] [address]  [facility]   Primary medical care  Accepts Medicare, [place], insurance and self-pay [phone] [address] [place], [place] [address]  [facility]  For [place] residents between the ages of 90 and 53 who do not have any form of health insurance, Medicare, [place], or [place] benefits.  Services are provided free of charge to uninsured patients who fall within federal poverty guidelines.    Hours: Tuesdays and Thursdays, 4:15 -  8 pm [phone] [address] [place], [place] [address]  [facility]     Primary medical care  Dental care  Nutritional counseling  Pharmacy  Accepts Medicaid, Medicare, most insurance.  Fees are adjusted based on ability to pay.   [place], [place] [address] [place], [place], [facility], [place],  [place] [facility] [place], [place]  [facility]  Womens health and family planning [phone] [place], [place] care  Emergency assistance for housing and [employer]  Medicaid [phone] [address] [place], [place] [address]   [facility] [place] [facility]    Ages 2 and older  Hours: Mondays and Thursdays, 7:00 am - 9:00 am Patients are seen on a first come, first served basis. [phone], ext. [place], [place]  Child care  Emergency assistance for housing and [employer]  Medicaid [phone] [address], [place] [address]  [facility] [employer]  Medication assistance  Rental assistance  Food pantry  Medication assistance  Housing assistance  Emergency food distribution  Utility assistance [place], [place]  [employer]. [place], [place] [address] Hours: Tuesdays and Thursdays from 9am - 12 noon by appointment only  [place], [place] [address]  [facility] [place] private insurance, [place], and [place].  Payment is based on a sliding scale for those without insurance.  Hours: Mondays, Tuesdays and Thursdays, 8:30 am - 5:30 pm.   [phone] [place], [place]  [facility] - Family Medicine at [facility], [place], and [place].  Payment is based on a sliding scale for those without insurance. [phone] [address] [place], [place]  [facility] - Pediatrics at [address] Scientist, research (medical), [employer], and [place].  Payment is based on a sliding scale for those without insurance [phone] [place], [employer], [place]  [facility] - Pediatrics at [employer], [place], and [place].  Payment is based on a sliding scale for those without insurance. [phone] [place], [place]  [facility] - Pediatrics at [facility], [place], and [place].  Payment is based on a sliding scale for those without insurance. [phone], ext. [phone] [place], [place].    [place] care.  Accepts Medicaid and self-pay. [place], [place]

## 2015-04-17 NOTE — ED Notes (Signed)
Pt's mother in waiting room. She confirmed the code. Explained to mother how triple X pt's work and that it is for the pt's safety, she understood.

## 2015-04-17 NOTE — ED Notes (Signed)
Patient transported to CT scan . 
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
## 2015-04-17 NOTE — Progress Notes (Signed)
Orthopedic Tech Progress Note Patient Details:  Alexandra Short XXXMims 03/27/1990 [id number]  Ortho Devices Type of Ortho Device: Arm sling Ortho Device/Splint Interventions: Application   Alexandra FordyceJennifer C Floraine Buechler 04/17/2015, 9:25 AM

## 2015-04-17 NOTE — ED Notes (Signed)
Ice pack placed on R face

## 2015-05-02 ENCOUNTER — Encounter (HOSPITAL_COMMUNITY): Payer: Self-pay | Admitting: *Deleted

## 2015-05-02 ENCOUNTER — Emergency Department (HOSPITAL_COMMUNITY)
Admission: EM | Admit: 2015-05-02 | Discharge: 2015-05-02 | Disposition: A | Discharge status: Home / Self Care | Payer: Medicaid Other | Attending: Emergency Medicine | Admitting: Emergency Medicine

## 2015-05-02 DIAGNOSIS — Z79899 Other long term (current) drug therapy: Secondary | ICD-10-CM | POA: Diagnosis not present

## 2015-05-02 DIAGNOSIS — N611 Abscess of the breast and nipple: Secondary | ICD-10-CM | POA: Diagnosis not present

## 2015-05-02 DIAGNOSIS — N63 Unspecified lump in breast: Secondary | ICD-10-CM | POA: Diagnosis present

## 2015-05-02 DIAGNOSIS — F17200 Nicotine dependence, unspecified, uncomplicated: Secondary | ICD-10-CM | POA: Insufficient documentation

## 2015-05-02 MED ORDER — NAPROXEN 500 MG PO TABS: 500.0000 mg | ORAL_TABLET | Freq: Two times a day (BID) | ORAL | Status: DC

## 2015-05-02 MED ORDER — SULFAMETHOXAZOLE-TRIMETHOPRIM 800-160 MG PO TABS: 1.0000 | ORAL_TABLET | Freq: Two times a day (BID) | ORAL | Status: AC

## 2015-05-02 MED ORDER — HYDROCODONE-ACETAMINOPHEN 5-325 MG PO TABS
1.0000 | ORAL_TABLET | Freq: Once | ORAL | Status: AC
Start: 2015-05-02 — End: 2015-05-02
  Administered 2015-05-02: 1 via ORAL
  Filled 2015-05-02: qty 1

## 2015-05-02 MED ORDER — LIDOCAINE-EPINEPHRINE (PF) 2 %-1:200000 IJ SOLN
10.0000 mL | Freq: Once | INTRAMUSCULAR | Status: AC
  Administered 2015-05-02: 10 mL
  Filled 2015-05-02: qty 20

## 2015-05-02 NOTE — ED Provider Notes (Signed)
CSN: [id number]     Arrival date & time 05/02/15  2024 History  By signing my name below, I, Evon Slackerrance Branch, attest that this documentation has been prepared under the direction and in the presence of Ixchel Duck Y Marquett Bertoli, [place]. Electronically Signed: Evon Slackerrance Branch, ED Scribe. 05/02/2015. 9:27 PM.      Chief Complaint  Patient presents with  . Breast Mass   The history is provided by the patient. No language interpreter was used.   HPI Comments: Alexandra Short is a 25 y.o. female who presents to the Emergency Department complaining of recurrent left breast abscess onset 2 days prior. Pt states she was seen for the same mass under left breast on February 11, 2015. US at that time showed retroareolar abscess. She states that she was sent home with antibiotics that provided relief. she was instructed to f/u with gen surg which she never did. Denies any medications PTA. Pt denies drainage, fever, chills, nausea or vomiting.     Past Medical History  Diagnosis Date  . Postpartum depression 06/11/2010   History reviewed. No pertinent past surgical history. Family History  Problem Relation Age of Onset  . Hypertension Mother   . Diabetes Father   . Diabetes Maternal Aunt   . Diabetes Maternal Uncle    Social History  Substance Use Topics  . Smoking status: Current Every Day Smoker -- 0.25 packs/day  . Smokeless tobacco: None  . Alcohol Use: Yes   OB History    Gravida Para Term Preterm AB TAB SAB Ectopic Multiple Living   2 2 2  0 0 0 0 0 0 2     Review of Systems  Constitutional: Negative for fever and chills.  Gastrointestinal: Negative for nausea and vomiting.  Skin:       +abscess  All other systems reviewed and are negative.     Allergies  Review of patient's allergies indicates no known allergies.  Home Medications   Prior to Admission medications   Medication Sig Start Date End Date Taking? Authorizing Provider  HYDROcodone-acetaminophen (NORCO/VICODIN) 5-325 MG tablet  Take 1-2 tablets by mouth every 6 hours as needed for pain and/or cough. 04/17/15   Nicole Pisciotta, PA-C  ibuprofen (ADVIL,MOTRIN) 200 MG tablet Take 400-600 mg by mouth every 6 (six) hours as needed for moderate pain.    Historical Provider, MD  sertraline (ZOLOFT) 50 MG tablet Take 1 tablet (50 mg total) by mouth daily. 06/11/10 06/11/11  Jonnie KindWeston W Saunders, MD   BP 120/95 mmHg  Pulse 97  Temp(Src) 98.1 F (36.7 C) (Oral)  Resp 18  SpO2 98%  LMP 04/24/2015   Physical Exam  Constitutional: She is oriented to person, place, and time. She appears well-developed and well-nourished. No distress.  HENT:  Head: Normocephalic and atraumatic.  Eyes: Conjunctivae and EOM are normal.  Neck: Neck supple. No tracheal deviation present.  Cardiovascular: Normal rate.   Pulmonary/Chest: Effort normal. No respiratory distress.    Musculoskeletal: Normal range of motion.  Neurological: She is alert and oriented to person, place, and time.  Skin: Skin is warm and dry.  Psychiatric: She has a normal mood and affect. Her behavior is normal.  Nursing note and vitals reviewed.   ED Course  Procedures (including critical care time)    Fine Needle Aspiration Performed by: Noelle PennerSerena Deann Mclaine Consent: Verbal consent obtained. Risks and benefits: risks, benefits and alternatives were discussed Type: abscess  Body area: left breast  Anesthesia: local infiltration  24G needle was used.  Local anesthetic: lidocaine 2% with epinephrine  Anesthetic total: 2 ml  Complexity: complex Blunt dissection to break up loculations  Drainage: purulent  Drainage amount: 3CC   Patient tolerance: Patient tolerated the procedure well with no immediate complications.   DIAGNOSTIC STUDIES: Oxygen Saturation is 98% on RA, normal by my interpretation.    COORDINATION OF CARE: 9:26 PM-Discussed treatment plan with pt at bedside and pt agreed to plan.     Labs Review Labs Reviewed - No data to  display  Imaging Review No results found.    EKG Interpretation None      MDM   Final diagnoses:  Breast abscess   Pt tolerated aspiration of abscess well. Placed on bactrim. Instructed to f/u with general surgery this week for further evaluation and possible repeat needle aspiration. Naproxen as needed for pain. ER return precautions given.   I personally performed the services described in this documentation, which was scribed in my presence. The recorded information has been reviewed and is accurate.     Carlene Coria, PA-C 05/03/15 1610  Linwood Dibbles, MD 05/03/15 2325

## 2015-05-02 NOTE — ED Notes (Signed)
Pt c/o lump to left breast. States she had it in december and it went away after antibiotic use. States that it came back bigger. She came here in January and was told that if it came back then it may need to be drained.

## 2015-05-02 NOTE — Discharge Instructions (Signed)
You were seen in the ER today for evaluation of a breast abscess. We helped you drain it today with a needle. Please call [facility] [place] [facility] to schedule a follow up appointment as soon as possible. They might need to repeat the aspiration procedure. In the meantime take and finish antibiotics as prescribed. Return to the ER for new or worsening symptoms.

## 2015-05-09 ENCOUNTER — Other Ambulatory Visit: Payer: Self-pay | Admitting: General Surgery

## 2015-05-09 DIAGNOSIS — N611 Abscess of the breast and nipple: Secondary | ICD-10-CM

## 2015-05-10 ENCOUNTER — Other Ambulatory Visit: Payer: Self-pay | Admitting: General Surgery

## 2015-05-10 ENCOUNTER — Ambulatory Visit
Admission: RE | Admit: 2015-05-10 | Discharge: 2015-05-10 | Disposition: A | Discharge status: Home / Self Care | Payer: Medicaid Other | Source: Ambulatory Visit | Attending: General Surgery | Admitting: General Surgery

## 2015-05-10 DIAGNOSIS — N611 Abscess of the breast and nipple: Secondary | ICD-10-CM

## 2015-05-11 ENCOUNTER — Other Ambulatory Visit: Payer: Self-pay | Admitting: General Surgery

## 2015-05-11 DIAGNOSIS — N63 Unspecified lump in unspecified breast: Secondary | ICD-10-CM

## 2015-05-15 ENCOUNTER — Other Ambulatory Visit: Payer: Self-pay | Admitting: General Surgery

## 2015-05-15 DIAGNOSIS — N611 Abscess of the breast and nipple: Secondary | ICD-10-CM

## 2015-05-24 ENCOUNTER — Other Ambulatory Visit: Payer: Medicaid Other

## 2015-08-31 ENCOUNTER — Other Ambulatory Visit: Payer: Self-pay | Admitting: General Surgery

## 2015-08-31 ENCOUNTER — Ambulatory Visit
Admission: RE | Admit: 2015-08-31 | Discharge: 2015-08-31 | Disposition: A | Discharge status: Home / Self Care | Payer: Medicaid Other | Source: Ambulatory Visit | Attending: General Surgery | Admitting: General Surgery

## 2015-08-31 DIAGNOSIS — N611 Abscess of the breast and nipple: Secondary | ICD-10-CM

## 2015-09-03 ENCOUNTER — Other Ambulatory Visit: Payer: Medicaid Other

## 2015-09-05 LAB — AEROBIC/ANAEROBIC CULTURE (SURGICAL/DEEP WOUND)

## 2015-09-05 LAB — AEROBIC/ANAEROBIC CULTURE W GRAM STAIN (SURGICAL/DEEP WOUND)

## 2017-06-01 ENCOUNTER — Emergency Department (HOSPITAL_COMMUNITY)
Admission: EM | Admit: 2017-06-01 | Discharge: 2017-06-01 | Disposition: A | Discharge status: Home / Self Care | Payer: Medicaid Other | Attending: Emergency Medicine | Admitting: Emergency Medicine

## 2017-06-01 ENCOUNTER — Encounter (HOSPITAL_COMMUNITY): Payer: Self-pay

## 2017-06-01 DIAGNOSIS — F17210 Nicotine dependence, cigarettes, uncomplicated: Secondary | ICD-10-CM | POA: Diagnosis not present

## 2017-06-01 DIAGNOSIS — N611 Abscess of the breast and nipple: Secondary | ICD-10-CM | POA: Insufficient documentation

## 2017-06-01 MED ORDER — CEPHALEXIN 500 MG PO CAPS: 500.0000 mg | ORAL_CAPSULE | Freq: Four times a day (QID) | ORAL | 0 refills | Status: DC

## 2017-06-01 NOTE — ED Notes (Signed)
ED Provider at bedside. 

## 2017-06-01 NOTE — ED Provider Notes (Signed)
[facility] Provider Note   CSN: [id number] Arrival date & time: 06/01/17  0827     History   Chief Complaint Chief Complaint  Patient presents with  . Abscess    HPI Alexandra Short is a 27 y.o. female.  Patient reports that initially symptoms began 3 days ago.  She states that she initially had an area of redness, pain, warmth noted to the medial left breast that extends just to the areole.  Patient reports that today, the area started having some purulent drainage noted.  Patient reports that she has a history of abscesses in similar locations previously.  Patient reports she has had to go to the breast center before.  Patient reports that she attempted to go there at this time but states that she needed to come to the ED for singular referral.  Patient reports some subjective fever chills but has not actually measured a temperature.  Patient states she has not had any discharge from the nipple.  The history is provided by the patient.    Past Medical History:  Diagnosis Date  . Postpartum depression 06/11/2010    Patient Active Problem List   Diagnosis Date Noted  . Contraceptive management 08/26/2013  . Postpartum depression 06/11/2010  . SMOKER 03/07/2010    History reviewed. No pertinent surgical history.   OB History    Gravida  2   Para  2   Term  2   Preterm  0   AB  0   Living  2     SAB  0   TAB  0   Ectopic  0   Multiple  0   Live Births  1            Home Medications    Prior to Admission medications   Medication Sig Start Date End Date Taking? Authorizing Provider  ibuprofen (ADVIL,MOTRIN) 200 MG tablet Take 400-600 mg by mouth every 6 (six) hours as needed for moderate pain.   Yes [provider]  cephALEXin (KEFLEX) 500 MG capsule Take 1 capsule (500 mg total) by mouth 4 (four) times daily. 06/01/17   Maxwell CaulLayden, Lindsey A, PA-C    Family History Family History  Problem Relation Age of  Onset  . Hypertension Mother   . Diabetes Father   . Diabetes Maternal Aunt   . Diabetes Maternal Uncle     Social History Social History   Tobacco Use  . Smoking status: Current Every Day Smoker    Packs/day: 0.25  . Smokeless tobacco: Never Used  Substance Use Topics  . Alcohol use: Yes  . Drug use: Yes    Types: Marijuana     Allergies   Patient has no known allergies.   Review of Systems Review of Systems  Constitutional: Positive for chills and fever (subjective).  Skin: Positive for color change and wound.     Physical Exam Updated Vital Signs BP 125/82 (BP Location: Right Arm)   Pulse 68   Temp 98.2 F (36.8 C) (Oral)   Resp 16   LMP 04/13/2017   SpO2 100%   Physical Exam  Constitutional: She appears well-developed and well-nourished.  HENT:  Head: Normocephalic and atraumatic.  Eyes: Conjunctivae and EOM are normal. Right eye exhibits no discharge. Left eye exhibits no discharge. No scleral icterus.  Pulmonary/Chest: Effort normal. Left breast exhibits tenderness.  The exam was performed with a chaperone present. No abnormalities of the right breast.  Neurological: She is alert.  Skin: Skin is warm and dry.  Psychiatric: She has a normal mood and affect. Her speech is normal and behavior is normal.  Nursing note and vitals reviewed.    ED Treatments / Results  Labs (all labs ordered are listed, but only abnormal results are displayed) Labs Reviewed - No data to display  EKG None  Radiology No results found.  Procedures Procedures (including critical care time)  Medications Ordered in ED Medications - No data to display   Initial Impression / Assessment and Plan / ED Course  I have reviewed the triage vital signs and the nursing notes.  Pertinent labs & imaging results that were available during my care of the patient were reviewed by me and considered in my medical decision making (see chart for details).     27 year old  female who presents for evaluation of breast abscess to the left breast x3 days.  Has history of same before.  Was seen by breast center previously.  Attempted to see them for the symptoms but states she needed a referral.  Reports subjective fever chills.  No nipple discharge. Patient is afebrile, non-toxic appearing, sitting comfortably on examination table. Vital signs reviewed and stable.  On exam, patient does have a 2 cm area of fluctuance that is actively draining purulent discharge with surrounding warmth, erythema consistent with a breast abscess.  No nipple involvement.  Right breast is unremarkable.  I discussed treatment options with patient.  Given that it is so close to the [place], do not feel comfortable finding this in the department.  I discussed this with patient and she is in agreement does not wish to have I&D done in the ED.  Patient would just  like a referral to the breast center.  We will plan to start patient on antibiotic therapy.  Patient with no known drug allergies.  Patient instructed to follow-up with the breast center as directed. Patient had ample opportunity for questions and discussion. All patient's questions were answered with full understanding. Strict return precautions discussed. Patient expresses understanding and agreement to plan.   Final Clinical Impressions(s) / ED Diagnoses   Final diagnoses:  Breast abscess    ED Discharge Orders        Ordered    cephALEXin (KEFLEX) 500 MG capsule  4 times daily     06/01/17 1032       Rosana Hoes 06/01/17 1105    Bethann Berkshire, MD 06/02/17 [phone]

## 2017-06-01 NOTE — Discharge Instructions (Signed)
You can take Tylenol or Ibuprofen as directed for pain. You can alternate Tylenol and Ibuprofen every 4 hours. If you take Tylenol at 1pm, then you can take Ibuprofen at 5pm. Then you can take Tylenol again at 9pm.   Take antibiotics as directed. Please take all of your antibiotics until finished.  Apply warm compresses in the area to continue helping with drainage.  Follow-up with the breast center for further evaluation and treatment.  Return the emergency department for any fever, nipple discharge, vomiting, difficulty breathing or any other worsening or concerning symptoms.

## 2017-06-01 NOTE — ED Triage Notes (Signed)
Pt reports recurrent abscess to left breast. Pt reports she has been seen for same multiple times and tried to make appointment at breast center but was told she needed new referral.

## 2017-06-01 NOTE — ED Notes (Signed)
Pt c/o left breast abscesses.

## 2017-06-29 ENCOUNTER — Other Ambulatory Visit: Payer: Self-pay

## 2017-06-29 ENCOUNTER — Ambulatory Visit (INDEPENDENT_AMBULATORY_CARE_PROVIDER_SITE_OTHER): Payer: Medicaid Other | Admitting: Family Medicine

## 2017-06-29 ENCOUNTER — Encounter: Payer: Self-pay | Admitting: Family Medicine

## 2017-06-29 VITALS — BP 102/64 | HR 72 | Temp 98.1°F | Ht 68.0 in | Wt 145.0 lb

## 2017-06-29 DIAGNOSIS — Z7689 Persons encountering health services in other specified circumstances: Secondary | ICD-10-CM | POA: Diagnosis not present

## 2017-06-29 DIAGNOSIS — F411 Generalized anxiety disorder: Secondary | ICD-10-CM | POA: Diagnosis not present

## 2017-06-29 DIAGNOSIS — Z3169 Encounter for other general counseling and advice on procreation: Secondary | ICD-10-CM | POA: Insufficient documentation

## 2017-06-29 DIAGNOSIS — F322 Major depressive disorder, single episode, severe without psychotic features: Secondary | ICD-10-CM

## 2017-06-29 DIAGNOSIS — F330 Major depressive disorder, recurrent, mild: Secondary | ICD-10-CM | POA: Insufficient documentation

## 2017-06-29 MED ORDER — FLUOXETINE HCL 10 MG PO TABS: 10.0000 mg | ORAL_TABLET | Freq: Every day | ORAL | 3 refills | Status: DC

## 2017-06-29 NOTE — Assessment & Plan Note (Signed)
Patient needs pap smear and Tdap, which will be done next visit.  Encouraged to continue cutting down on substance use and that we have patches and gum to help her quit if she is interested.

## 2017-06-29 NOTE — Assessment & Plan Note (Addendum)
Patient has had two children that were naturally conceived, is not overweight or underweight, and does not have menstrual irregularities.  It is possible that substance use could be contributing to her or her partner's fertility.  Encouraged to quit substance use before getting pregnant and for her own health.  Pregnancy could also create more stress for her, since she already has significant depression and anxiety.  We can obtain CBC, BMP, and TSH to check for other causes for infertility at next visit if patient desires.

## 2017-06-29 NOTE — Patient Instructions (Addendum)
It was nice meeting you today Alexandra Short!  Today, we are starting Prozac 10 mg for your anxiety and depression.  Please take one pill per day.  I would like to see you in 1-2 weeks, when we will do a pap smear, give you a tetanus shot, and do labs for your infertility.  If you have any questions or concerns, please feel free to call the clinic.   Be well,  Dr. Frances Furbish

## 2017-06-29 NOTE — Assessment & Plan Note (Signed)
Started Prozac due to severity of anxiety.  Encouraged to see integrated care for more support.

## 2017-06-29 NOTE — Progress Notes (Signed)
Subjective:    Alexandra Short - 27 y.o. female MRN [id number]  Date of birth: [birth year]  HPI  Alexandra Short is here to establish care.  Her current concerns include infertility.  Has been trying to get pregnant since 2015.  Periods can sometimes be one or two weeks late but she has never skipped a month or had irregular periods.  Had two children in 2010 and 2012.  Denies previous D and C or other surgeries.  She is also concerned about her anxiety and depression, which she has had for a long time.  She has never taken medications for her mood and has never been hospitalized.  She does express some desire to hurt herself but denies suicidal ideations.  She also denies any specific plan to hurt herself.  She is interested in integrated care and medication.  PMH: breast abscesses that come and go for the past two years on her L breast near her areola.   PSH: none  Meds: none  FH: diabetes and depression and anxiety in mother and father  Social Hx: monogamous with one female partner, lives with boyfriend, two daughters, and boyfriend's brother and sister.  Tobacco use: now smokes one half ppd, did smoke one pack per day.  Alcohol use: 3-4 drinks per day.  Smokes about 5 blunts of marijuana per day.  During teenage years, used ecstasy, cocaine.  She is working on cutting down on her substance use.  She is currently on probation.  Health Maintenance:  Last pap smear was in 2011 Wants tetanus next visit Health Maintenance Due  Topic Date Due  . TETANUS/TDAP  11/16/2008  . PAP SMEAR  01/17/2013    -  reports that she has been smoking.  She has been smoking about 0.50 packs per day. She has never used smokeless tobacco. - Review of Systems: Per HPI. - Past Medical History: Patient Active Problem List   Diagnosis Date Noted  . Encounter to establish care 06/29/2017  . Infertility counseling 06/29/2017  . Current severe episode of major depressive disorder without psychotic features (HCC)  06/29/2017  . Generalized anxiety disorder 06/29/2017  . SMOKER 03/07/2010   - Medications: reviewed and updated   Objective:   Physical Exam BP 102/64   Pulse 72   Temp 98.1 F (36.7 C) (Oral)   Ht  (1.727 m)   Wt 145 lb (65.8 kg)   LMP 06/09/2017   SpO2 99%   BMI 22.05 kg/m  Gen: NAD, alert, cooperative with exam, well-appearing HEENT: NCAT, PERRL, clear conjunctiva, oropharynx clear, supple neck, poor dentition CV: RRR, good S1/S2, no murmur, no edema Resp: CTABL, no wheezes, non-labored Abd: SNTND, BS present, no guarding or organomegaly Skin: no rashes, normal turgor  Neuro: no gross deficits.  Psych: fair insight and judgment   Depression screen PHQ 2/9 06/29/2017  Decreased Interest 2  Down, Depressed, Hopeless 3  PHQ - 2 Score 5  Altered sleeping 3  Tired, decreased energy 3  Change in appetite 3  Feeling bad or failure about yourself  3  Trouble concentrating 3  Moving slowly or fidgety/restless 2  Suicidal thoughts 1  PHQ-9 Score 23  Difficult doing work/chores Extremely dIfficult   GAD 7 : Generalized Anxiety Score 06/29/2017  Nervous, Anxious, on Edge 3  Control/stop worrying 3  Worry too much - different things 3  Trouble relaxing 2  Restless 2  Easily annoyed or irritable 3  Afraid - awful might happen 2  Total GAD  7 Score 18  Anxiety Difficulty Very difficult          Assessment & Plan:   Encounter to establish care Patient needs pap smear and Tdap, which will be done next visit.  Encouraged to continue cutting down on substance use and that we have patches and gum to help her quit if she is interested.  Infertility counseling Patient has had two children that were naturally conceived, is not overweight or underweight, and does not have menstrual irregularities.  It is possible that substance use could be contributing to her or her partner's fertility.  Encouraged to quit substance use before getting pregnant and for her own health.   Pregnancy could also create more stress for her, since she already has significant depression and anxiety.  We can obtain CBC, BMP, and TSH to check for other causes for infertility at next visit if patient desires.   Current severe episode of major depressive disorder without psychotic features (HCC) Started Prozac 10 mg due to severity of patient's symptoms and concern for self harm.  Encouraged to see integrated health.  I would like to see her back in 1-2 weeks.  Generalized anxiety disorder Started Prozac due to severity of anxiety.  Encouraged to see integrated care for more support.    Lezlie Octave, M.D. 06/29/2017, 12:06 PM PGY-1, [facility]

## 2017-06-29 NOTE — Assessment & Plan Note (Signed)
Started Prozac 10 mg due to severity of patient's symptoms and concern for self harm.  Encouraged to see integrated health.  I would like to see her back in 1-2 weeks.

## 2017-07-07 ENCOUNTER — Encounter: Payer: Self-pay | Admitting: Family Medicine

## 2017-07-07 ENCOUNTER — Other Ambulatory Visit (HOSPITAL_COMMUNITY)
Admission: RE | Admit: 2017-07-07 | Discharge: 2017-07-07 | Disposition: A | Discharge status: Home / Self Care | Payer: Medicaid Other | Source: Ambulatory Visit | Attending: Family Medicine | Admitting: Family Medicine

## 2017-07-07 ENCOUNTER — Other Ambulatory Visit: Payer: Self-pay

## 2017-07-07 ENCOUNTER — Ambulatory Visit: Payer: Medicaid Other | Admitting: Family Medicine

## 2017-07-07 VITALS — BP 112/76 | HR 64 | Temp 98.1°F | Ht 68.0 in | Wt 144.6 lb

## 2017-07-07 DIAGNOSIS — Z113 Encounter for screening for infections with a predominantly sexual mode of transmission: Secondary | ICD-10-CM | POA: Insufficient documentation

## 2017-07-07 DIAGNOSIS — F320 Major depressive disorder, single episode, mild: Secondary | ICD-10-CM | POA: Diagnosis not present

## 2017-07-07 DIAGNOSIS — F411 Generalized anxiety disorder: Secondary | ICD-10-CM

## 2017-07-07 DIAGNOSIS — Z3169 Encounter for other general counseling and advice on procreation: Secondary | ICD-10-CM | POA: Diagnosis not present

## 2017-07-07 DIAGNOSIS — Z23 Encounter for immunization: Secondary | ICD-10-CM

## 2017-07-07 DIAGNOSIS — F17200 Nicotine dependence, unspecified, uncomplicated: Secondary | ICD-10-CM

## 2017-07-07 DIAGNOSIS — R87610 Atypical squamous cells of undetermined significance on cytologic smear of cervix (ASC-US): Secondary | ICD-10-CM | POA: Insufficient documentation

## 2017-07-07 DIAGNOSIS — A749 Chlamydial infection, unspecified: Secondary | ICD-10-CM | POA: Diagnosis not present

## 2017-07-07 DIAGNOSIS — Z0000 Encounter for general adult medical examination without abnormal findings: Secondary | ICD-10-CM | POA: Diagnosis not present

## 2017-07-07 LAB — POCT WET PREP (WET MOUNT)
Clue Cells Wet Prep Whiff POC: NEGATIVE
Trichomonas Wet Prep HPF POC: ABSENT

## 2017-07-07 NOTE — Assessment & Plan Note (Signed)
Performed GC/Chlamydia and wet prep today.

## 2017-07-07 NOTE — Assessment & Plan Note (Addendum)
Advised patient that her friend may have received a Clomid injection and that we would need to work up her infertility before referring her to GYN for this.  Also reiterated that she would benefit from smoking and alcohol cessation before conceiving.  Will obtain CBC, BMP, and TSH to begin infertility work up due to patient request.

## 2017-07-07 NOTE — Assessment & Plan Note (Signed)
Congratulated patient on her progress.  Offered patient a visit with Dr. Macky Lower tobacco cessation clinic and described options including patches, gum, and quit line.  Patient will consider these.

## 2017-07-07 NOTE — Patient Instructions (Addendum)
It was nice seeing you today Keirsten!  Today, we did a pap smear as well as gonorrhea and chlamydia testing and testing for yeast infection, bacterial vaginosis, and trichomonas.  We will let you know if any of these results are abnormal.  We also did initial labs for infertility work up.  I will let you know if any of these are abnormal.  Please keep up the good work on cutting down smoking, marijuana and alcohol use.  All of this will be good for your health and will increase the chances of getting pregnant.   Please keep taking the Prozac once per day.  I would like to see you back in one month to see how you're doing and increase your medication dose if we think that will be helpful.   If you have any questions or concerns, please feel free to call the clinic.   Be well,  Dr. Frances Furbish

## 2017-07-07 NOTE — Assessment & Plan Note (Signed)
Performed pap and received Tdap today.

## 2017-07-07 NOTE — Assessment & Plan Note (Signed)
Patient's GAD improved to 5 today.  Will continue current dose of Prozac.

## 2017-07-07 NOTE — Assessment & Plan Note (Signed)
PHQ9 improved to 8 today without thoughts of self harm.  Patient will continue taking Prozac as prescribed, and we will reevaluate dose in about a month.

## 2017-07-07 NOTE — Progress Notes (Signed)
Subjective:    Alexandra Short - 27 y.o. female MRN [id number]  Date of birth: [birth year]  HPI  Alexandra Short is here for follow up of depression and anxiety and for her pap smear and Tdap.  Depression/Anxiety - has been taking Prozac 10 mg daily as prescribed  - has not noticed a difference but reports no side effects - understands that it may take a month or longer to get full effect of medication  Infertility - would like initial infertility labs today - asked about "injection her friend got to help her get pregnant"  Smoking cessation - using E-cigarette to help her quit - feels motivated to continue cutting down  STD check - would like gonorrhea, chlamydia, wet prep along with pap today - says that she experienced some pelvic pain during intercourse last week, which is new for her  Health Maintenance:  Health Maintenance Due  Topic Date Due  . TETANUS/TDAP  11/16/2008  . PAP SMEAR  01/17/2013    -  reports that she has been smoking.  She has been smoking about 0.50 packs per day. She has never used smokeless tobacco. - Review of Systems: Per HPI. - Past Medical History: Patient Active Problem List   Diagnosis Date Noted  . Screening for STDs (sexually transmitted diseases) 07/07/2017  . Healthcare maintenance 07/07/2017  . Encounter to establish care 06/29/2017  . Infertility counseling 06/29/2017  . Current severe episode of major depressive disorder without psychotic features (HCC) 06/29/2017  . Generalized anxiety disorder 06/29/2017  . Tobacco use disorder 03/07/2010   - Medications: reviewed and updated   Objective:   Physical Exam BP 112/76   Pulse 64   Temp 98.1 F (36.7 C) (Oral)   Ht  (1.727 m)   Wt 144 lb 9.6 oz (65.6 kg)   LMP 06/09/2017   SpO2 99%   BMI 21.99 kg/m  Gen: NAD, alert, cooperative with exam, well-appearing HEENT: NCAT, PERRL, clear conjunctiva, oropharynx clear, supple neck CV: RRR, good S1/S2, no murmur, no edema,  capillary refill brisk  Resp: CTABL, no wheezes, non-labored Abd: SNTND, BS present, no guarding or organomegaly Skin: no rashes, normal turgor  Neuro: no gross deficits.  Psych: good insight, alert and oriented  Depression screen [facility] 2/9 07/07/2017 06/29/2017 06/29/2017  Decreased Interest 2 2 0  Down, Depressed, Hopeless 0 3 0  PHQ - 2 Score 2 5 0  Altered sleeping 2 3 -  Tired, decreased energy 2 3 -  Change in appetite 1 3 -  Feeling bad or failure about yourself  0 3 -  Trouble concentrating 1 3 -  Moving slowly or fidgety/restless 0 2 -  Suicidal thoughts 0 1 -  PHQ-9 Score 8 23 -  Difficult doing work/chores - Extremely dIfficult -   GAD 7 : Generalized Anxiety Score 07/07/2017 06/29/2017  Nervous, Anxious, on Edge 1 3  Control/stop worrying 1 3  Worry too much - different things 1 3  Trouble relaxing 1 2  Restless 0 2  Easily annoyed or irritable 1 3  Afraid - awful might happen 0 2  Total GAD 7 Score 5 18  Anxiety Difficulty - Very difficult          Assessment & Plan:   Current severe episode of major depressive disorder without psychotic features (HCC) PHQ9 improved to 8 today without thoughts of self harm.  Patient will continue taking Prozac as prescribed, and we will reevaluate dose in about a month.  Generalized anxiety disorder Patient's GAD improved to 5 today.  Will continue current dose of Prozac.  Infertility counseling Advised patient that her friend may have received a Clomid injection and that we would need to work up her infertility before referring her to GYN for this.  Also reiterated that she would benefit from smoking and alcohol cessation before conceiving.  Will obtain CBC, BMP, and TSH to begin infertility work up due to patient request.  Tobacco use disorder Congratulated patient on her progress.  Offered patient a visit with Dr. Macky Lower tobacco cessation clinic and described options including patches, gum, and quit line.  Patient will  consider these.  Screening for STDs (sexually transmitted diseases) Performed GC/Chlamydia and wet prep today.  Healthcare maintenance Performed pap and received Tdap today.    Lezlie Octave, M.D. 07/07/2017, 10:38 AM PGY-1, [facility]

## 2017-07-08 ENCOUNTER — Encounter: Payer: Self-pay | Admitting: Family Medicine

## 2017-07-08 LAB — BASIC METABOLIC PANEL
BUN/Creatinine Ratio: 15 (ref 9–23)
BUN: 12 mg/dL (ref 6–20)
CALCIUM: 9.4 mg/dL (ref 8.7–10.2)
CO2: 21 mmol/L (ref 20–29)
CREATININE: 0.82 mg/dL (ref 0.57–1.00)
Chloride: 103 mmol/L (ref 96–106)
GFR, EST AFRICAN AMERICAN: 113 mL/min/{1.73_m2} (ref >59)
GFR, EST NON AFRICAN AMERICAN: 98 mL/min/{1.73_m2} (ref >59)
Glucose: 98 mg/dL (ref 65–99)
Potassium: 4.3 mmol/L (ref 3.5–5.2)
SODIUM: 139 mmol/L (ref 134–144)

## 2017-07-08 LAB — CBC
Hematocrit: 41.4 % (ref 34.0–46.6)
Hemoglobin: 13.6 g/dL (ref 11.1–15.9)
MCH: 28.6 pg (ref 26.6–33.0)
MCHC: 32.9 g/dL (ref 31.5–35.7)
MCV: 87 fL (ref 79–97)
PLATELETS: 318 10*3/uL (ref 150–450)
RBC: 4.76 x10E6/uL (ref 3.77–5.28)
RDW: 14.6 % (ref 12.3–15.4)
WBC: 9.9 10*3/uL (ref 3.4–10.8)

## 2017-07-08 LAB — TSH: TSH: 1.09 u[IU]/mL (ref 0.450–4.500)

## 2017-07-10 ENCOUNTER — Telehealth: Payer: Self-pay | Admitting: Family Medicine

## 2017-07-10 ENCOUNTER — Other Ambulatory Visit: Payer: Self-pay | Admitting: Family Medicine

## 2017-07-10 LAB — CYTOLOGY - PAP
Chlamydia: POSITIVE — AB
Diagnosis: UNDETERMINED — AB
HPV (WINDOPATH): NOT DETECTED
Neisseria Gonorrhea: NEGATIVE

## 2017-07-10 MED ORDER — AZITHROMYCIN 500 MG PO TABS: 1000.0000 mg | ORAL_TABLET | Freq: Once | ORAL | 0 refills | Status: AC

## 2017-07-10 NOTE — Telephone Encounter (Signed)
Patient called and informed that her pap showed some atypical cells but was negative for HPV, so she can be retested in three years (like normal screening) or in one year depending on her preference.  She was also told that her test was positive for chlamydia and elected to pick up azithromycin from her pharmacy.  I told her that I would send the medication for her, and that all of her sexual partners need to be tested for chlamydia as well.  I advised that she abstain from having sex until all of her partners have been tested and treated.  She expressed understanding with this plan.

## 2017-08-06 ENCOUNTER — Ambulatory Visit: Payer: Medicaid Other | Admitting: Family Medicine

## 2017-08-21 ENCOUNTER — Emergency Department (HOSPITAL_COMMUNITY): Payer: Medicaid Other

## 2017-08-21 ENCOUNTER — Emergency Department (HOSPITAL_COMMUNITY)
Admission: EM | Admit: 2017-08-21 | Discharge: 2017-08-21 | Disposition: A | Discharge status: Home / Self Care | Payer: Medicaid Other | Attending: Emergency Medicine | Admitting: Emergency Medicine

## 2017-08-21 ENCOUNTER — Encounter (HOSPITAL_COMMUNITY): Payer: Self-pay | Admitting: Emergency Medicine

## 2017-08-21 ENCOUNTER — Other Ambulatory Visit: Payer: Self-pay

## 2017-08-21 DIAGNOSIS — S022XXA Fracture of nasal bones, initial encounter for closed fracture: Secondary | ICD-10-CM | POA: Insufficient documentation

## 2017-08-21 DIAGNOSIS — S0181XA Laceration without foreign body of other part of head, initial encounter: Secondary | ICD-10-CM | POA: Insufficient documentation

## 2017-08-21 DIAGNOSIS — Y929 Unspecified place or not applicable: Secondary | ICD-10-CM | POA: Diagnosis not present

## 2017-08-21 DIAGNOSIS — W228XXA Striking against or struck by other objects, initial encounter: Secondary | ICD-10-CM | POA: Diagnosis not present

## 2017-08-21 DIAGNOSIS — Y999 Unspecified external cause status: Secondary | ICD-10-CM | POA: Insufficient documentation

## 2017-08-21 DIAGNOSIS — Y939 Activity, unspecified: Secondary | ICD-10-CM | POA: Insufficient documentation

## 2017-08-21 DIAGNOSIS — F17210 Nicotine dependence, cigarettes, uncomplicated: Secondary | ICD-10-CM | POA: Insufficient documentation

## 2017-08-21 MED ORDER — LIDOCAINE HCL 2 % IJ SOLN
20.0000 mL | Freq: Once | INTRAMUSCULAR | Status: AC
  Administered 2017-08-21: 400 mg via INTRADERMAL
  Filled 2017-08-21: qty 20

## 2017-08-21 MED ORDER — OXYCODONE-ACETAMINOPHEN 5-325 MG PO TABS
1.0000 | ORAL_TABLET | ORAL | Status: DC | PRN
  Administered 2017-08-21: 1 via ORAL
  Filled 2017-08-21: qty 1
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
## 2017-08-21 NOTE — ED Notes (Addendum)
Suture cart at bedside 

## 2017-08-21 NOTE — Discharge Instructions (Signed)
Return here as needed. Follow up with the ENT doctor. Have sutures out in 5 days.

## 2017-09-12 ENCOUNTER — Ambulatory Visit (HOSPITAL_COMMUNITY)
Admission: EM | Admit: 2017-09-12 | Discharge: 2017-09-12 | Disposition: A | Discharge status: Home / Self Care | Payer: Medicaid Other

## 2017-09-12 DIAGNOSIS — S0181XA Laceration without foreign body of other part of head, initial encounter: Secondary | ICD-10-CM

## 2017-09-12 DIAGNOSIS — Z4802 Encounter for removal of sutures: Secondary | ICD-10-CM

## 2017-09-12 NOTE — ED Triage Notes (Signed)
Pt here for suture removal. States she had them placed 3 weeks ago. Pt states "I just couldn't get around to it". Wound is well healed and no bleeding noted. 6 sutures removed. Pt had no further questions.

## 2017-09-14 ENCOUNTER — Ambulatory Visit: Payer: Medicaid Other

## 2017-10-05 ENCOUNTER — Ambulatory Visit (INDEPENDENT_AMBULATORY_CARE_PROVIDER_SITE_OTHER): Payer: Medicaid Other | Admitting: Family Medicine

## 2017-10-05 ENCOUNTER — Other Ambulatory Visit: Payer: Self-pay

## 2017-10-05 ENCOUNTER — Encounter: Payer: Self-pay | Admitting: Family Medicine

## 2017-10-05 VITALS — BP 110/70 | HR 67 | Temp 98.5°F | Ht 69.0 in | Wt 150.0 lb

## 2017-10-05 DIAGNOSIS — Z23 Encounter for immunization: Secondary | ICD-10-CM

## 2017-10-05 DIAGNOSIS — F330 Major depressive disorder, recurrent, mild: Secondary | ICD-10-CM

## 2017-10-05 DIAGNOSIS — Z3481 Encounter for supervision of other normal pregnancy, first trimester: Secondary | ICD-10-CM | POA: Diagnosis not present

## 2017-10-05 DIAGNOSIS — N912 Amenorrhea, unspecified: Secondary | ICD-10-CM

## 2017-10-05 DIAGNOSIS — F17200 Nicotine dependence, unspecified, uncomplicated: Secondary | ICD-10-CM

## 2017-10-05 LAB — POCT URINE PREGNANCY: Preg Test, Ur: POSITIVE — AB

## 2017-10-05 MED ORDER — PRENATAL VITAMINS 0.8 MG PO TABS: 1.0000 | ORAL_TABLET | Freq: Every day | ORAL | 3 refills | Status: DC

## 2017-10-05 MED ORDER — FLUOXETINE HCL 10 MG PO TABS: 10.0000 mg | ORAL_TABLET | Freq: Every day | ORAL | 3 refills | Status: DC

## 2017-10-05 NOTE — Progress Notes (Signed)
Subjective:    Alexandra Short - 27 y.o. female MRN [id number]  Date of birth: Dec 17, 1990  HPI  Alexandra Short is here for a positive pregnancy test.  She would also like to discuss her mood.  Pregnancy - First date of last menstrual period July 8 - Periods are usually regular, but patient would prefer to have a dating ultrasound performed - Patient took several pregnancy tests a week ago, which were all positive - Patient has been having unprotected sex with her boyfriend for several years hoping to get pregnant - Is surprised but pleased about this pregnancy - Has cut down her smoking to 1 or 2 cigarettes/day -Has been taking prenatal vitamins and does not have exposure to cats  Mood -Patient had been taking Prozac and found that it was helpful, but she was arrested a month and a half ago, and she says that she stopped taking it at that time because the people at the jail to get away from her and did not give it back -Thinks that her depression and anxiety are somewhat better and would like to continue taking Prozac during her pregnancy   Health Maintenance:  -Is agreeable to getting the flu shot today There are no preventive care reminders to display for this patient.  -  reports that she has been smoking. She has never used smokeless tobacco. - Review of Systems: Per HPI. - Past Medical History: Patient Active Problem List   Diagnosis Date Noted  . Supervision of normal pregnancy 10/07/2017  . Screening for STDs (sexually transmitted diseases) 07/07/2017  . Healthcare maintenance 07/07/2017  . 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 Generalized anxiety disorder 06/29/2017  . Tobacco use disorder 03/07/2010   - Medications: reviewed and updated   Objective:   Physical Exam BP 110/70   Pulse 67   Temp 98.5 F (36.9 C) (Oral)   Ht 5\' 9"  (1.753 m)   Wt 150 lb (68 kg)   LMP 08/17/2017   SpO2 98%   BMI 22.15 kg/m  Gen: NAD, alert,  cooperative with exam, well-appearing CV: RRR, good S1/S2, no murmur, no edema Resp: CTABL, no wheezes, non-labored Abd: SNTND, BS present, no guarding or organomegaly  Depression screen [facility] 2/9 10/07/2017 07/07/2017 06/29/2017  Decreased Interest 1 2 2   Down, Depressed, Hopeless 0 0 3  PHQ - 2 Score 1 2 5   Altered sleeping 1 2 3   Tired, decreased energy 1 2 3   Change in appetite 1 1 3   Feeling bad or failure about yourself  0 0 3  Trouble concentrating 1 1 3   Moving slowly or fidgety/restless 0 0 2  Suicidal thoughts 0 0 1  PHQ-9 Score 5 8 23   Difficult doing work/chores - - Extremely dIfficult   GAD 7 : Generalized Anxiety Score 10/07/2017 07/07/2017 06/29/2017  Nervous, Anxious, on Edge 1 1 3   Control/stop worrying 1 1 3   Worry too much - different things 1 1 3   Trouble relaxing 1 1 2   Restless 0 0 2  Easily annoyed or irritable 1 1 3   Afraid - awful might happen 0 0 2  Total GAD 7 Score 5 5 18   Anxiety Difficulty - - Very difficult         Assessment & Plan:   Supervision of normal pregnancy Pregnancy test is positive today.  Will schedule transvaginal ultrasound for dating and obtain prenatal labs today.  We will schedule patient for initial OB appointment in the following  weeks.  Patient strongly encouraged to quit smoking completely for her and her baby's health.  Mild episode of recurrent depressive disorder [facility](HCC) Patient would like to continue Prozac during pregnancy.  Patient was advised that pregnancy is a category C medication, so there may be some risks to her fetus.  However, SSRIs are frequently used in pregnancy, and Prozac is one of the longest studied SSRIs.  We will continue this medication during pregnancy due to patient request and recent severity of anxiety and depression as noted on May PHQ-9 and GAD-7.  Tobacco use disorder Risks of tobacco use during pregnancy were reviewed with patient, and patient strongly encouraged to quit cigarettes.  She was also  advised against alcohol and marijuana use.    Lezlie OctaveAmanda Winfrey, M.D. 10/07/2017, 9:59 AM PGY-2, [facility]

## 2017-10-05 NOTE — Patient Instructions (Addendum)
It was nice seeing you today Ms. Alexandra Short!  Congratulations on your pregnancy!  I reordered your Prozac today.  Please continue taking one pill daily.  We are collecting prenatal labs today.  I will let you know if any of these are abnormal.  Please take a prenatal vitamin daily and continue cutting down on cigarettes.  Please come back in for your initial OB appointment in the next few weeks.  If you have any questions or concerns, please feel free to call the clinic.   Be well,  Dr. Frances FurbishWinfrey

## 2017-10-07 DIAGNOSIS — Z3490 Encounter for supervision of normal pregnancy, unspecified, unspecified trimester: Secondary | ICD-10-CM | POA: Insufficient documentation

## 2017-10-07 LAB — OBSTETRIC PANEL, INCLUDING HIV
ANTIBODY SCREEN: NEGATIVE
BASOS ABS: 0.1 10*3/uL (ref 0.0–0.2)
BASOS: 0 %
EOS (ABSOLUTE): 0.2 10*3/uL (ref 0.0–0.4)
Eos: 2 %
HEMATOCRIT: 40.4 % (ref 34.0–46.6)
HIV SCREEN 4TH GENERATION: NONREACTIVE
Hemoglobin: 13.5 g/dL (ref 11.1–15.9)
Hepatitis B Surface Ag: NEGATIVE
Immature Grans (Abs): 0.1 10*3/uL (ref 0.0–0.1)
Immature Granulocytes: 1 %
LYMPHS ABS: 2.5 10*3/uL (ref 0.7–3.1)
Lymphs: 21 %
MCH: 28.9 pg (ref 26.6–33.0)
MCHC: 33.4 g/dL (ref 31.5–35.7)
MCV: 87 fL (ref 79–97)
MONOS ABS: 0.6 10*3/uL (ref 0.1–0.9)
Monocytes: 5 %
NEUTROS ABS: 8.4 10*3/uL — AB (ref 1.4–7.0)
Neutrophils: 71 %
Platelets: 337 10*3/uL (ref 150–450)
RBC: 4.67 x10E6/uL (ref 3.77–5.28)
RDW: 12.5 % (ref 12.3–15.4)
RPR Ser Ql: NONREACTIVE
RUBELLA: 3.54 {index} (ref >0.99)
Rh Factor: POSITIVE
WBC: 11.8 10*3/uL — AB (ref 3.4–10.8)

## 2017-10-07 LAB — URINE CULTURE, OB REFLEX

## 2017-10-07 LAB — CULTURE, OB URINE

## 2017-10-07 NOTE — Assessment & Plan Note (Signed)
Patient would like to continue Prozac during pregnancy.  Patient was advised that pregnancy is a category C medication, so there may be some risks to her fetus.  However, SSRIs are frequently used in pregnancy, and Prozac is one of the longest studied SSRIs.  We will continue this medication during pregnancy due to patient request and recent severity of anxiety and depression as noted on May PHQ-9 and GAD-7.

## 2017-10-07 NOTE — Assessment & Plan Note (Signed)
Risks of tobacco use during pregnancy were reviewed with patient, and patient strongly encouraged to quit cigarettes.  She was also advised against alcohol and marijuana use.

## 2017-10-07 NOTE — Assessment & Plan Note (Signed)
Pregnancy test is positive today.  Will schedule transvaginal ultrasound for dating and obtain prenatal labs today.  We will schedule patient for initial OB appointment in the following weeks.  Patient strongly encouraged to quit smoking completely for her and her baby's health.

## 2017-10-09 ENCOUNTER — Ambulatory Visit (HOSPITAL_COMMUNITY)
Admission: RE | Admit: 2017-10-09 | Discharge: 2017-10-09 | Disposition: A | Discharge status: Home / Self Care | Payer: Medicaid Other | Source: Ambulatory Visit | Attending: Family Medicine | Admitting: Family Medicine

## 2017-10-09 DIAGNOSIS — Z3481 Encounter for supervision of other normal pregnancy, first trimester: Secondary | ICD-10-CM

## 2017-10-13 ENCOUNTER — Encounter: Payer: Self-pay | Admitting: Family Medicine

## 2017-10-14 ENCOUNTER — Encounter: Payer: Medicaid Other | Admitting: Family Medicine

## 2017-11-18 ENCOUNTER — Other Ambulatory Visit (HOSPITAL_COMMUNITY)
Admission: RE | Admit: 2017-11-18 | Discharge: 2017-11-18 | Disposition: A | Discharge status: Home / Self Care | Payer: Medicaid Other | Source: Ambulatory Visit | Attending: Family Medicine | Admitting: Family Medicine

## 2017-11-18 ENCOUNTER — Ambulatory Visit (INDEPENDENT_AMBULATORY_CARE_PROVIDER_SITE_OTHER): Payer: Medicaid Other | Admitting: Family Medicine

## 2017-11-18 ENCOUNTER — Encounter: Payer: Self-pay | Admitting: Family Medicine

## 2017-11-18 ENCOUNTER — Other Ambulatory Visit: Payer: Self-pay

## 2017-11-18 DIAGNOSIS — Z3481 Encounter for supervision of other normal pregnancy, first trimester: Secondary | ICD-10-CM | POA: Insufficient documentation

## 2017-11-18 DIAGNOSIS — O99340 Other mental disorders complicating pregnancy, unspecified trimester: Secondary | ICD-10-CM

## 2017-11-18 MED ORDER — PRENATAL LOW IRON 27-0.8 MG PO TABS: 1.0000 | ORAL_TABLET | Freq: Every day | ORAL | 0 refills | Status: DC

## 2017-11-18 NOTE — Progress Notes (Signed)
Subjective:    Alexandra Short is a [id number] Unknown being seen today for her first obstetrical visit.  Her obstetrical history is significant for non-compliance and smoker. Patient does not intend to breast feed. Pregnancy history fully reviewed.  She continues to smoke about 4 to 5 cigarettes/day.  She also smokes marijuana every day, because this helps with her nausea.  She is not interested in medication such as likely just to help with her nausea.  She will be going to jail for the next month and has some anxiety regarding this.  She is not taking Prozac currently.  She is also not taking her prenatal vitamins regularly because they cause her to be constipated.  Patient reports nausea, no bleeding, no contractions, no cramping and no leaking.  Vitals:   11/18/17 1114  BP: 108/72  Pulse: 91  Temp: 98.5 F (36.9 C)  Weight: 148 lb (67.1 kg)    HISTORY: OB History  Gravida Para Term Preterm AB Living  3 2 2  0 0 2  SAB TAB Ectopic Multiple Live Births  0 0 0 0 1    # Outcome Date GA Lbr Len/2nd Weight Sex Delivery Anes PTL Lv  3 Current           2 Term 05/17/10             Birth Comments: Trey Paula  1 Term 01/17/09 [redacted]w[redacted]d  6 lb 1 oz (2.75 kg) F Vag-Spont EPI N LIV     Birth Comments: IOL for poor weight gain per pt.    Past Medical History:  Diagnosis Date  . Postpartum depression 06/11/2010   No past surgical history on file. Family History  Problem Relation Age of Onset  . Hypertension Mother   . Depression Mother   . Anxiety disorder Mother   . Diabetes Father   . Diabetes Maternal Aunt   . Diabetes Maternal Uncle      Exam    Uterus:     Pelvic Exam:    Perineum: No Hemorrhoids   Vulva: normal   Vagina:  normal mucosa, normal discharge   pH: Not checked   Cervix: multiparous appearance, no cervical motion tenderness and no lesions   Adnexa: normal adnexa   Bony Pelvis: gynecoid  System: Breast:  normal appearance, no masses or tenderness   Skin:  normal coloration and turgor, no rashes    Neurologic: oriented, normal mood, grossly non-focal, CN II-XII intact   Extremities: normal strength, tone, and muscle mass   HEENT PERRLA, sclera clear, anicteric, neck supple with midline trachea and thyroid without masses   Mouth/Teeth MMM, poor dentition   Neck supple and no masses   Cardiovascular: regular rate and rhythm, no murmurs or gallops   Respiratory:  appears well, vitals normal, no respiratory distress, acyanotic, normal RR, ear and throat exam is normal, neck free of mass or lymphadenopathy, chest clear, no wheezing, crepitations, rhonchi, normal symmetric air entry   Abdomen: soft, non-tender; bowel sounds normal; no masses,  no organomegaly   Urinary: urethral meatus normal      Assessment:    Pregnancy: [id number] Patient Active Problem List   Diagnosis Date Noted  . Supervision of normal pregnancy 10/07/2017  . Screening for STDs (sexually transmitted diseases) 07/07/2017  . Healthcare maintenance 07/07/2017  . 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 Generalized anxiety disorder 06/29/2017  . Tobacco use disorder 03/07/2010        Plan:  Initial labs drawn in August, although GC and Chlamydia were not conducted at that time and therefore were conducted today. Interested in the quad screen Prenatal vitamins-was highly encouraged to continue taking these and to try a low or no iron variety. Was highly encouraged to cut down or stop cigarette and marijuana smoking and was told that these pose risks to her baby. Was counseled on the benefits of breast-feeding. Problem list reviewed and updated. Genetic Screening discussed Quad Screen: requested.  Will need to be scheduled for mid-November since patient is going to jail for the next month.  She should still be within the window at that time.  Ultrasound discussed; fetal survey: undecided.  Follow up in 4 weeks.   Lennox Solders 11/18/2017

## 2017-11-18 NOTE — Patient Instructions (Addendum)
It was nice seeing you today Alexandra Short!  We performed one more test today, and I will let you know if it is abnormal.  Please try prenatal vitamins with low iron that I prescribed, or you can find prenatal vitamins without iron at the drug store.  We can perform the quad screen (blood test to look for chromosome abnormalities) when we see you next time in November - I'd like to see you in about four weeks.  If you have any questions or concerns, please feel free to call the clinic.   Be well,  Dr. Frances Furbish   First Trimester of Pregnancy The first trimester of pregnancy is from week 1 until the end of week 13 (months 1 through 3). A week after a sperm fertilizes an egg, the egg will implant on the wall of the uterus. This embryo will begin to develop into a baby. Genes from you and your partner will form the baby. The female genes will determine whether the baby will be a boy or a girl. At 6-8 weeks, the eyes and face will be formed, and the heartbeat can be seen on ultrasound. At the end of 12 weeks, all the baby's organs will be formed. Now that you are pregnant, you will want to do everything you can to have a healthy baby. Two of the most important things are to get good prenatal care and to follow your health care provider's instructions. Prenatal care is all the medical care you receive before the baby's birth. This care will help prevent, find, and treat any problems during the pregnancy and childbirth. Body changes during your first trimester Your body goes through many changes during pregnancy. The changes vary from woman to woman.  You may gain or lose a couple of pounds at first.  You may feel sick to your stomach (nauseous) and you may throw up (vomit). If the vomiting is uncontrollable, call your health care provider.  You may tire easily.  You may develop headaches that can be relieved by medicines. All medicines should be approved by your health care provider.  You may urinate  more often. Painful urination may mean you have a bladder infection.  You may develop heartburn as a result of your pregnancy.  You may develop constipation because certain hormones are causing the muscles that push stool through your intestines to slow down.  You may develop hemorrhoids or swollen veins (varicose veins).  Your breasts may begin to grow larger and become tender. Your nipples may stick out more, and the tissue that surrounds them (areola) may become darker.  Your gums may bleed and may be sensitive to brushing and flossing.  Dark spots or blotches (chloasma, mask of pregnancy) may develop on your face. This will likely fade after the baby is born.  Your menstrual periods will stop.  You may have a loss of appetite.  You may develop cravings for certain kinds of food.  You may have changes in your emotions from day to day, such as being excited to be pregnant or being concerned that something may go wrong with the pregnancy and baby.  You may have more vivid and strange dreams.  You may have changes in your hair. These can include thickening of your hair, rapid growth, and changes in texture. Some women also have hair loss during or after pregnancy, or hair that feels dry or thin. Your hair will most likely return to normal after your baby is born.  What to  expect at prenatal visits During a routine prenatal visit:  You will be weighed to make sure you and the baby are growing normally.  Your blood pressure will be taken.  Your abdomen will be measured to track your baby's growth.  The fetal heartbeat will be listened to between weeks 10 and 14 of your pregnancy.  Test results from any previous visits will be discussed.  Your health care provider may ask you:  How you are feeling.  If you are feeling the baby move.  If you have had any abnormal symptoms, such as leaking fluid, bleeding, severe headaches, or abdominal cramping.  If you are using any  tobacco products, including cigarettes, chewing tobacco, and electronic cigarettes.  If you have any questions.  Other tests that may be performed during your first trimester include:  Blood tests to find your blood type and to check for the presence of any previous infections. The tests will also be used to check for low iron levels (anemia) and protein on red blood cells (Rh antibodies). Depending on your risk factors, or if you previously had diabetes during pregnancy, you may have tests to check for high blood sugar that affects pregnant women (gestational diabetes).  Urine tests to check for infections, diabetes, or protein in the urine.  An ultrasound to confirm the proper growth and development of the baby.  Fetal screens for spinal cord problems (spina bifida) and Down syndrome.  HIV (human immunodeficiency virus) testing. Routine prenatal testing includes screening for HIV, unless you choose not to have this test.  You may need other tests to make sure you and the baby are doing well.  Follow these instructions at home: Medicines  Follow your health care provider's instructions regarding medicine use. Specific medicines may be either safe or unsafe to take during pregnancy.  Take a prenatal vitamin that contains at least 600 micrograms (mcg) of folic acid.  If you develop constipation, try taking a stool softener if your health care provider approves. Eating and drinking  Eat a balanced diet that includes fresh fruits and vegetables, whole grains, good sources of protein such as meat, eggs, or tofu, and low-fat dairy. Your health care provider will help you determine the amount of weight gain that is right for you.  Avoid raw meat and uncooked cheese. These carry germs that can cause birth defects in the baby.  Eating four or five small meals rather than three large meals a day may help relieve nausea and vomiting. If you start to feel nauseous, eating a few soda crackers can  be helpful. Drinking liquids between meals, instead of during meals, also seems to help ease nausea and vomiting.  Limit foods that are high in fat and processed sugars, such as fried and sweet foods.  To prevent constipation: ? Eat foods that are high in fiber, such as fresh fruits and vegetables, whole grains, and beans. ? Drink enough fluid to keep your urine clear or pale yellow. Activity  Exercise only as directed by your health care provider. Most women can continue their usual exercise routine during pregnancy. Try to exercise for 30 minutes at least 5 days a week. Exercising will help you: ? Control your weight. ? Stay in shape. ? Be prepared for labor and delivery.  Experiencing pain or cramping in the lower abdomen or lower back is a good sign that you should stop exercising. Check with your health care provider before continuing with normal exercises.  Try to avoid standing  for long periods of time. Move your legs often if you must stand in one place for a long time.  Avoid heavy lifting.  Wear low-heeled shoes and practice good posture.  You may continue to have sex unless your health care provider tells you not to. Relieving pain and discomfort  Wear a good support bra to relieve breast tenderness.  Take warm sitz baths to soothe any pain or discomfort caused by hemorrhoids. Use hemorrhoid cream if your health care provider approves.  Rest with your legs elevated if you have leg cramps or low back pain.  If you develop varicose veins in your legs, wear support hose. Elevate your feet for 15 minutes, 3-4 times a day. Limit salt in your diet. Prenatal care  Schedule your prenatal visits by the twelfth week of pregnancy. They are usually scheduled monthly at first, then more often in the last 2 months before delivery.  Write down your questions. Take them to your prenatal visits.  Keep all your prenatal visits as told by your health care provider. This is  important. Safety  Wear your seat belt at all times when driving.  Make a list of emergency phone numbers, including numbers for family, friends, the hospital, and police and fire departments. General instructions  Ask your health care provider for a referral to a local prenatal education class. Begin classes no later than the beginning of month 6 of your pregnancy.  Ask for help if you have counseling or nutritional needs during pregnancy. Your health care provider can offer advice or refer you to specialists for help with various needs.  Do not use hot tubs, steam rooms, or saunas.  Do not douche or use tampons or scented sanitary pads.  Do not cross your legs for long periods of time.  Avoid cat litter boxes and soil used by cats. These carry germs that can cause birth defects in the baby and possibly loss of the fetus by miscarriage or stillbirth.  Avoid all smoking, herbs, alcohol, and medicines not prescribed by your health care provider. Chemicals in these products affect the formation and growth of the baby.  Do not use any products that contain nicotine or tobacco, such as cigarettes and e-cigarettes. If you need help quitting, ask your health care provider. You may receive counseling support and other resources to help you quit.  Schedule a dentist appointment. At home, brush your teeth with a soft toothbrush and be gentle when you floss. Contact a health care provider if:  You have dizziness.  You have mild pelvic cramps, pelvic pressure, or nagging pain in the abdominal area.  You have persistent nausea, vomiting, or diarrhea.  You have a bad smelling vaginal discharge.  You have pain when you urinate.  You notice increased swelling in your face, hands, legs, or ankles.  You are exposed to fifth disease or chickenpox.  You are exposed to [place] measles (rubella) and have never had it. Get help right away if:  You have a fever.  You are leaking fluid from your  vagina.  You have spotting or bleeding from your vagina.  You have severe abdominal cramping or pain.  You have rapid weight gain or loss.  You vomit blood or material that looks like coffee grounds.  You develop a severe headache.  You have shortness of breath.  You have any kind of trauma, such as from a fall or a car accident. Summary  The first trimester of pregnancy is from week 1  until the end of week 13 (months 1 through 3).  Your body goes through many changes during pregnancy. The changes vary from woman to woman.  You will have routine prenatal visits. During those visits, your health care provider will examine you, discuss any test results you may have, and talk with you about how you are feeling. This information is not intended to replace advice given to you by your health care provider. Make sure you discuss any questions you have with your health care provider. Document Released: 01/21/2001 Document Revised: 01/09/2016 Document Reviewed: 01/09/2016 Elsevier Interactive Patient Education  2018 [employer].   Second Trimester of Pregnancy The second trimester is from week 14 through week 27 (months 4 through 6). The second trimester is often a time when you feel your best. Your body has adjusted to being pregnant, and you begin to feel better physically. Usually, morning sickness has lessened or quit completely, you may have more energy, and you may have an increase in appetite. The second trimester is also a time when the fetus is growing rapidly. At the end of the sixth month, the fetus is about 9 inches long and weighs about 1 pounds. You will likely begin to feel the baby move (quickening) between 16 and 20 weeks of pregnancy. Body changes during your second trimester Your body continues to go through many changes during your second trimester. The changes vary from woman to woman.  Your weight will continue to increase. You will notice your lower abdomen bulging  out.  You may begin to get stretch marks on your hips, abdomen, and breasts.  You may develop headaches that can be relieved by medicines. The medicines should be approved by your health care provider.  You may urinate more often because the fetus is pressing on your bladder.  You may develop or continue to have heartburn as a result of your pregnancy.  You may develop constipation because certain hormones are causing the muscles that push waste through your intestines to slow down.  You may develop hemorrhoids or swollen, bulging veins (varicose veins).  You may have back pain. This is caused by: ? Weight gain. ? Pregnancy hormones that are relaxing the joints in your pelvis. ? A shift in weight and the muscles that support your balance.  Your breasts will continue to grow and they will continue to become tender.  Your gums may bleed and may be sensitive to brushing and flossing.  Dark spots or blotches (chloasma, mask of pregnancy) may develop on your face. This will likely fade after the baby is born.  A dark line from your belly button to the pubic area (linea nigra) may appear. This will likely fade after the baby is born.  You may have changes in your hair. These can include thickening of your hair, rapid growth, and changes in texture. Some women also have hair loss during or after pregnancy, or hair that feels dry or thin. Your hair will most likely return to normal after your baby is born.  What to expect at prenatal visits During a routine prenatal visit:  You will be weighed to make sure you and the fetus are growing normally.  Your blood pressure will be taken.  Your abdomen will be measured to track your baby's growth.  The fetal heartbeat will be listened to.  Any test results from the previous visit will be discussed.  Your health care provider may ask you:  How you are feeling.  If you  are feeling the baby move.  If you have had any abnormal symptoms,  such as leaking fluid, bleeding, severe headaches, or abdominal cramping.  If you are using any tobacco products, including cigarettes, chewing tobacco, and electronic cigarettes.  If you have any questions.  Other tests that may be performed during your second trimester include:  Blood tests that check for: ? Low iron levels (anemia). ? High blood sugar that affects pregnant women (gestational diabetes) between 31 and 28 weeks. ? Rh antibodies. This is to check for a protein on red blood cells (Rh factor).  Urine tests to check for infections, diabetes, or protein in the urine.  An ultrasound to confirm the proper growth and development of the baby.  An amniocentesis to check for possible genetic problems.  Fetal screens for spina bifida and Down syndrome.  HIV (human immunodeficiency virus) testing. Routine prenatal testing includes screening for HIV, unless you choose not to have this test.  Follow these instructions at home: Medicines  Follow your health care provider's instructions regarding medicine use. Specific medicines may be either safe or unsafe to take during pregnancy.  Take a prenatal vitamin that contains at least 600 micrograms (mcg) of folic acid.  If you develop constipation, try taking a stool softener if your health care provider approves. Eating and drinking  Eat a balanced diet that includes fresh fruits and vegetables, whole grains, good sources of protein such as meat, eggs, or tofu, and low-fat dairy. Your health care provider will help you determine the amount of weight gain that is right for you.  Avoid raw meat and uncooked cheese. These carry germs that can cause birth defects in the baby.  If you have low calcium intake from food, talk to your health care provider about whether you should take a daily calcium supplement.  Limit foods that are high in fat and processed sugars, such as fried and sweet foods.  To prevent constipation: ? Drink  enough fluid to keep your urine clear or pale yellow. ? Eat foods that are high in fiber, such as fresh fruits and vegetables, whole grains, and beans. Activity  Exercise only as directed by your health care provider. Most women can continue their usual exercise routine during pregnancy. Try to exercise for 30 minutes at least 5 days a week. Stop exercising if you experience uterine contractions.  Avoid heavy lifting, wear low heel shoes, and practice good posture.  A sexual relationship may be continued unless your health care provider directs you otherwise. Relieving pain and discomfort  Wear a good support bra to prevent discomfort from breast tenderness.  Take warm sitz baths to soothe any pain or discomfort caused by hemorrhoids. Use hemorrhoid cream if your health care provider approves.  Rest with your legs elevated if you have leg cramps or low back pain.  If you develop varicose veins, wear support hose. Elevate your feet for 15 minutes, 3-4 times a day. Limit salt in your diet. Prenatal Care  Write down your questions. Take them to your prenatal visits.  Keep all your prenatal visits as told by your health care provider. This is important. Safety  Wear your seat belt at all times when driving.  Make a list of emergency phone numbers, including numbers for family, friends, the hospital, and police and fire departments. General instructions  Ask your health care provider for a referral to a local prenatal education class. Begin classes no later than the beginning of month 6 of your pregnancy.  Ask for help if you have counseling or nutritional needs during pregnancy. Your health care provider can offer advice or refer you to specialists for help with various needs.  Do not use hot tubs, steam rooms, or saunas.  Do not douche or use tampons or scented sanitary pads.  Do not cross your legs for long periods of time.  Avoid cat litter boxes and soil used by cats. These  carry germs that can cause birth defects in the baby and possibly loss of the fetus by miscarriage or stillbirth.  Avoid all smoking, herbs, alcohol, and unprescribed drugs. Chemicals in these products can affect the formation and growth of the baby.  Do not use any products that contain nicotine or tobacco, such as cigarettes and e-cigarettes. If you need help quitting, ask your health care provider.  Visit your dentist if you have not gone yet during your pregnancy. Use a soft toothbrush to brush your teeth and be gentle when you floss. Contact a health care provider if:  You have dizziness.  You have mild pelvic cramps, pelvic pressure, or nagging pain in the abdominal area.  You have persistent nausea, vomiting, or diarrhea.  You have a bad smelling vaginal discharge.  You have pain when you urinate. Get help right away if:  You have a fever.  You are leaking fluid from your vagina.  You have spotting or bleeding from your vagina.  You have severe abdominal cramping or pain.  You have rapid weight gain or weight loss.  You have shortness of breath with chest pain.  You notice sudden or extreme swelling of your face, hands, ankles, feet, or legs.  You have not felt your baby move in over an hour.  You have severe headaches that do not go away when you take medicine.  You have vision changes. Summary  The second trimester is from week 14 through week 27 (months 4 through 6). It is also a time when the fetus is growing rapidly.  Your body goes through many changes during pregnancy. The changes vary from woman to woman.  Avoid all smoking, herbs, alcohol, and unprescribed drugs. These chemicals affect the formation and growth your baby.  Do not use any tobacco products, such as cigarettes, chewing tobacco, and e-cigarettes. If you need help quitting, ask your health care provider.  Contact your health care provider if you have any questions. Keep all prenatal visits  as told by your health care provider. This is important. This information is not intended to replace advice given to you by your health care provider. Make sure you discuss any questions you have with your health care provider. Document Released: 01/21/2001 Document Revised: 03/04/2016 Document Reviewed: 03/04/2016 Elsevier Interactive Patient Education  2018 [employer].   Smoking During Pregnancy Smoking during pregnancy is unhealthy for you and your baby. Smoke from cigarettes, pipes, and cigars contains many chemicals that can cause cancer (carcinogens). Cigarettes also contain a stimulant drug (nicotine). When you smoke, harmful substances that you breathe in enter your bloodstream and can be passed on to your baby. This can affect your baby's development. If you are planning to become pregnant or have recently become pregnant, talk with your health care provider about quitting smoking. How does smoking affect me? Smoking increases your risk for many long-term (chronic) diseases. These diseases include cancer, lung diseases, and heart disease. Smoking during pregnancy increases your risk of:  Losing the pregnancy (miscarriage or stillbirth).  Giving birth too early (premature birth).  Pregnancy outside of the uterus (tubal pregnancy).  Having problems with the organ that provides the baby nourishment and oxygen (placenta), including: ? Attachment of the placenta over the opening of the uterus (placenta previa). ? Detachment of the placenta before the baby's birth (placental abruption).  Having your water break before labor begins (premature rupture of membranes).  How does smoking affect my baby? Before Birth Smoking during pregnancy:  Decreases blood flow and oxygen to your baby.  Increases your baby's risk of birth defects, such as heart defects.  Increases your baby's heart rate.  Slows your baby's growth in the uterus (intrauterine growth retardation).  After  Birth Babies born to women who smoked during pregnancy may:  Have symptoms of nicotine withdrawal.  Need to stay in the hospital for special care.  May be too small at birth.  Have a high risk of: ? Serious health problems or lifelong disabilities. ? Sudden infant death syndrome (SIDS). ? Becoming obese. ? Developing behavior or learning problems.  What can happen if changes are not made? When babies are born with a birth defect or illness, they often need to stay in the hospital longer before going home. Hospital stays may also be longer if you had any complications during labor or delivery. Longer hospital stays and more treatments result in higher costs for health care. Many health issues among babies born to mothers who smoke can have a lifelong impact. This may include the long-term need for certain medicines, therapies, or other treatments. What are the benefits of not smoking during pregnancy? You have a much better chance of having a healthy pregnancy and a healthy baby if you do not smoke while you are pregnant. Not smoking also means that you will have a better chance of living a long and healthy life, and your baby will have a better chance of growing into a healthy child and adult. What actions can be taken? Quitting smoking can be difficult. Ask your health care provider for help to stop smoking. You may also consider:  Counseling to help you quit smoking (smoking cessation counseling).  Psychotherapy.  Acupuncture.  Hypnosis.  Telephone [employer].  If these methods do not help you, talk with your health care provider about other options. Do not take smoking cessation medicines or nicotine supplements unless your health care provider tells you to. Where to find more information: Learn more about smoking during pregnancy and quitting smoking from:  March of [employer]: www.marchofdimes.org/pregnancy/smoking-during-pregnancy.aspx  U.S. Department of Health and Human  Services: women.smokefree.gov  American Cancer Society: www.cancer.org  American Heart Association: www.heart.org  National Cancer Institute: www.cancer.gov  For help to quit smoking:  National smoking cessation telephone hotline: 1-800-QUIT NOW 780-561-6609)  Contact a health care provider if:  You are struggling to quit smoking.  You are a smoker and you become pregnant or plan to become pregnant.  You start smoking again after giving birth. Summary  Tobacco smoke contains harmful substances that can affect a baby's health and development.  Smoking increases the risk for serious problems, such as miscarriage, birth defects, or premature birth.  If you need help to quit smoking, ask your health care provider. This information is not intended to replace advice given to you by your health care provider. Make sure you discuss any questions you have with your health care provider. Document Released: 06/10/2004 Document Revised: 11/16/2015 Document Reviewed: 11/16/2015 Elsevier Interactive Patient Education  2018 [employer].

## 2017-11-19 LAB — CERVICOVAGINAL ANCILLARY ONLY
Chlamydia: NEGATIVE
Neisseria Gonorrhea: NEGATIVE

## 2018-01-28 ENCOUNTER — Other Ambulatory Visit: Payer: Self-pay

## 2018-01-28 ENCOUNTER — Ambulatory Visit (INDEPENDENT_AMBULATORY_CARE_PROVIDER_SITE_OTHER): Payer: Medicaid Other | Admitting: Family Medicine

## 2018-01-28 ENCOUNTER — Encounter: Payer: Self-pay | Admitting: Family Medicine

## 2018-01-28 VITALS — BP 98/62 | HR 92 | Temp 98.2°F | Wt 162.0 lb

## 2018-01-28 DIAGNOSIS — Z3482 Encounter for supervision of other normal pregnancy, second trimester: Secondary | ICD-10-CM

## 2018-01-28 DIAGNOSIS — R87619 Unspecified abnormal cytological findings in specimens from cervix uteri: Secondary | ICD-10-CM | POA: Insufficient documentation

## 2018-01-28 NOTE — Patient Instructions (Signed)
 Second Trimester of Pregnancy The second trimester is from week 14 through week 27 (months 4 through 6). The second trimester is often a time when you feel your best. Your body has adjusted to being pregnant, and you begin to feel better physically. Usually, morning sickness has lessened or quit completely, you may have more energy, and you may have an increase in appetite. The second trimester is also a time when the fetus is growing rapidly. At the end of the sixth month, the fetus is about 9 inches long and weighs about 1 pounds. You will likely begin to feel the baby move (quickening) between 16 and 20 weeks of pregnancy. Body changes during your second trimester Your body continues to go through many changes during your second trimester. The changes vary from woman to woman.  Your weight will continue to increase. You will notice your lower abdomen bulging out.  You may begin to get stretch marks on your hips, abdomen, and breasts.  You may develop headaches that can be relieved by medicines. The medicines should be approved by your health care provider.  You may urinate more often because the fetus is pressing on your bladder.  You may develop or continue to have heartburn as a result of your pregnancy.  You may develop constipation because certain hormones are causing the muscles that push waste through your intestines to slow down.  You may develop hemorrhoids or swollen, bulging veins (varicose veins).  You may have back pain. This is caused by: ? Weight gain. ? Pregnancy hormones that are relaxing the joints in your pelvis. ? A shift in weight and the muscles that support your balance.  Your breasts will continue to grow and they will continue to become tender.  Your gums may bleed and may be sensitive to brushing and flossing.  Dark spots or blotches (chloasma, mask of pregnancy) may develop on your face. This will likely fade after the baby is born.  A dark line from  your belly button to the pubic area (linea nigra) may appear. This will likely fade after the baby is born.  You may have changes in your hair. These can include thickening of your hair, rapid growth, and changes in texture. Some women also have hair loss during or after pregnancy, or hair that feels dry or thin. Your hair will most likely return to normal after your baby is born. What to expect at prenatal visits During a routine prenatal visit:  You will be weighed to make sure you and the fetus are growing normally.  Your blood pressure will be taken.  Your abdomen will be measured to track your baby's growth.  The fetal heartbeat will be listened to.  Any test results from the previous visit will be discussed. Your health care provider may ask you:  How you are feeling.  If you are feeling the baby move.  If you have had any abnormal symptoms, such as leaking fluid, bleeding, severe headaches, or abdominal cramping.  If you are using any tobacco products, including cigarettes, chewing tobacco, and electronic cigarettes.  If you have any questions. Other tests that may be performed during your second trimester include:  Blood tests that check for: ? Low iron levels (anemia). ? High blood sugar that affects pregnant women (gestational diabetes) between 24 and 28 weeks. ? Rh antibodies. This is to check for a protein on red blood cells (Rh factor).  Urine tests to check for infections, diabetes, or protein in   the urine.  An ultrasound to confirm the proper growth and development of the baby.  An amniocentesis to check for possible genetic problems.  Fetal screens for spina bifida and Down syndrome.  HIV (human immunodeficiency virus) testing. Routine prenatal testing includes screening for HIV, unless you choose not to have this test. Follow these instructions at home: Medicines  Follow your health care provider's instructions regarding medicine use. Specific medicines  may be either safe or unsafe to take during pregnancy.  Take a prenatal vitamin that contains at least 600 micrograms (mcg) of folic acid.  If you develop constipation, try taking a stool softener if your health care provider approves. Eating and drinking   Eat a balanced diet that includes fresh fruits and vegetables, whole grains, good sources of protein such as meat, eggs, or tofu, and low-fat dairy. Your health care provider will help you determine the amount of weight gain that is right for you.  Avoid raw meat and uncooked cheese. These carry germs that can cause birth defects in the baby.  If you have low calcium intake from food, talk to your health care provider about whether you should take a daily calcium supplement.  Limit foods that are high in fat and processed sugars, such as fried and sweet foods.  To prevent constipation: ? Drink enough fluid to keep your urine clear or pale yellow. ? Eat foods that are high in fiber, such as fresh fruits and vegetables, whole grains, and beans. Activity  Exercise only as directed by your health care provider. Most women can continue their usual exercise routine during pregnancy. Try to exercise for 30 minutes at least 5 days a week. Stop exercising if you experience uterine contractions.  Avoid heavy lifting, wear low heel shoes, and practice good posture.  A sexual relationship may be continued unless your health care provider directs you otherwise. Relieving pain and discomfort  Wear a good support bra to prevent discomfort from breast tenderness.  Take warm sitz baths to soothe any pain or discomfort caused by hemorrhoids. Use hemorrhoid cream if your health care provider approves.  Rest with your legs elevated if you have leg cramps or low back pain.  If you develop varicose veins, wear support hose. Elevate your feet for 15 minutes, 3-4 times a day. Limit salt in your diet. Prenatal Care  Write down your questions. Take  them to your prenatal visits.  Keep all your prenatal visits as told by your health care provider. This is important. Safety  Wear your seat belt at all times when driving.  Make a list of emergency phone numbers, including numbers for family, friends, the hospital, and police and fire departments. General instructions  Ask your health care provider for a referral to a local prenatal education class. Begin classes no later than the beginning of month 6 of your pregnancy.  Ask for help if you have counseling or nutritional needs during pregnancy. Your health care provider can offer advice or refer you to specialists for help with various needs.  Do not use hot tubs, steam rooms, or saunas.  Do not douche or use tampons or scented sanitary pads.  Do not cross your legs for long periods of time.  Avoid cat litter boxes and soil used by cats. These carry germs that can cause birth defects in the baby and possibly loss of the fetus by miscarriage or stillbirth.  Avoid all smoking, herbs, alcohol, and unprescribed drugs. Chemicals in these products can affect the   formation and growth of the baby.  Do not use any products that contain nicotine or tobacco, such as cigarettes and e-cigarettes. If you need help quitting, ask your health care provider.  Visit your dentist if you have not gone yet during your pregnancy. Use a soft toothbrush to brush your teeth and be gentle when you floss. Contact a health care provider if:  You have dizziness.  You have mild pelvic cramps, pelvic pressure, or nagging pain in the abdominal area.  You have persistent nausea, vomiting, or diarrhea.  You have a bad smelling vaginal discharge.  You have pain when you urinate. Get help right away if:  You have a fever.  You are leaking fluid from your vagina.  You have spotting or bleeding from your vagina.  You have severe abdominal cramping or pain.  You have rapid weight gain or weight loss.  You  have shortness of breath with chest pain.  You notice sudden or extreme swelling of your face, hands, ankles, feet, or legs.  You have not felt your baby move in over an hour.  You have severe headaches that do not go away when you take medicine.  You have vision changes. Summary  The second trimester is from week 14 through week 27 (months 4 through 6). It is also a time when the fetus is growing rapidly.  Your body goes through many changes during pregnancy. The changes vary from woman to woman.  Avoid all smoking, herbs, alcohol, and unprescribed drugs. These chemicals affect the formation and growth your baby.  Do not use any tobacco products, such as cigarettes, chewing tobacco, and e-cigarettes. If you need help quitting, ask your health care provider.  Contact your health care provider if you have any questions. Keep all prenatal visits as told by your health care provider. This is important. This information is not intended to replace advice given to you by your health care provider. Make sure you discuss any questions you have with your health care provider. Document Released: 01/21/2001 Document Revised: 03/04/2016 Document Reviewed: 03/04/2016 Elsevier Interactive Patient Education  2019 [employer].   Contraception Choices Contraception, also called birth control, refers to methods or devices that prevent pregnancy. Hormonal methods Contraceptive implant  A contraceptive implant is a thin, plastic tube that contains a hormone. It is inserted into the upper part of the arm. It can remain in place for up to 3 years. Progestin-only injections Progestin-only injections are injections of progestin, a synthetic form of the hormone progesterone. They are given every 3 months by a health care provider. Birth control pills  Birth control pills are pills that contain hormones that prevent pregnancy. They must be taken once a day, preferably at the same time each day. Birth  control patch  The birth control patch contains hormones that prevent pregnancy. It is placed on the skin and must be changed once a week for three weeks and removed on the fourth week. A prescription is needed to use this method of contraception. Vaginal ring  A vaginal ring contains hormones that prevent pregnancy. It is placed in the vagina for three weeks and removed on the fourth week. After that, the process is repeated with a new ring. A prescription is needed to use this method of contraception. Emergency contraceptive Emergency contraceptives prevent pregnancy after unprotected sex. They come in pill form and can be taken up to 5 days after sex. They work best the sooner they are taken after having sex. Most  emergency contraceptives are available without a prescription. This method should not be used as your only form of birth control. Barrier methods Female condom  A female condom is a thin sheath that is worn over the penis during sex. Condoms keep sperm from going inside a woman's body. They can be used with a spermicide to increase their effectiveness. They should be disposed after a single use. Female condom  A female condom is a soft, loose-fitting sheath that is put into the vagina before sex. The condom keeps sperm from going inside a woman's body. They should be disposed after a single use. Diaphragm  A diaphragm is a soft, dome-shaped barrier. It is inserted into the vagina before sex, along with a spermicide. The diaphragm blocks sperm from entering the uterus, and the spermicide kills sperm. A diaphragm should be left in the vagina for 6-8 hours after sex and removed within 24 hours. A diaphragm is prescribed and fitted by a health care provider. A diaphragm should be replaced every 1-2 years, after giving birth, after gaining more than 15 lb (6.8 kg), and after pelvic surgery. Cervical cap  A cervical cap is a round, soft latex or plastic cup that fits over the cervix. It is  inserted into the vagina before sex, along with spermicide. It blocks sperm from entering the uterus. The cap should be left in place for 6-8 hours after sex and removed within 48 hours. A cervical cap must be prescribed and fitted by a health care provider. It should be replaced every 2 years. Sponge  A sponge is a soft, circular piece of polyurethane foam with spermicide on it. The sponge helps block sperm from entering the uterus, and the spermicide kills sperm. To use it, you make it wet and then insert it into the vagina. It should be inserted before sex, left in for at least 6 hours after sex, and removed and thrown away within 30 hours. Spermicides Spermicides are chemicals that kill or block sperm from entering the cervix and uterus. They can come as a cream, jelly, suppository, foam, or tablet. A spermicide should be inserted into the vagina with an applicator at least 93-26 minutes before sex to allow time for it to work. The process must be repeated every time you have sex. Spermicides do not require a prescription. Intrauterine contraception Intrauterine device (IUD) An IUD is a T-shaped device that is put in a woman's uterus. There are two types:  Hormone IUD.This type contains progestin, a synthetic form of the hormone progesterone. This type can stay in place for 3-5 years.  Copper IUD.This type is wrapped in copper wire. It can stay in place for 10 years.  Permanent methods of contraception Female tubal ligation In this method, a woman's fallopian tubes are sealed, tied, or blocked during surgery to prevent eggs from traveling to the uterus. Hysteroscopic sterilization In this method, a small, flexible insert is placed into each fallopian tube. The inserts cause scar tissue to form in the fallopian tubes and block them, so sperm cannot reach an egg. The procedure takes about 3 months to be effective. Another form of birth control must be used during those 3 months. Female  sterilization This is a procedure to tie off the tubes that carry sperm (vasectomy). After the procedure, the man can still ejaculate fluid (semen). Natural planning methods Natural family planning In this method, a couple does not have sex on days when the woman could become pregnant. Calendar method This means keeping  wire. It can stay in place for 10 years.    Permanent methods of contraception  Female tubal ligation  In this method, a woman's fallopian tubes are sealed, tied, or blocked during surgery to prevent eggs from traveling to the uterus.  Hysteroscopic sterilization  In this method, a small, flexible insert is placed into each fallopian tube. The inserts cause scar tissue to form in the fallopian tubes and block them, so sperm cannot reach an egg. The procedure takes about 3 months to be effective. Another form of birth control must be used during those 3 months.  Female sterilization  This  is a procedure to tie off the tubes that carry sperm (vasectomy). After the procedure, the man can still ejaculate fluid (semen).  Natural planning methods  Natural family planning  In this method, a couple does not have sex on days when the woman could become pregnant.  Calendar method  This means keeping track of the length of each menstrual cycle, identifying the days when pregnancy can happen, and not having sex on those days.  Ovulation method  In this method, a couple avoids sex during ovulation.  Symptothermal method  This method involves not having sex during ovulation. The woman typically checks for ovulation by watching changes in her temperature and in the consistency of cervical mucus.  Post-ovulation method  In this method, a couple waits to have sex until after ovulation.  Summary  · Contraception, also called birth control, means methods or devices that prevent pregnancy.  · Hormonal methods of contraception include implants, injections, pills, patches, vaginal rings, and emergency contraceptives.  · Barrier methods of contraception can include female condoms, female condoms, diaphragms, cervical caps, sponges, and spermicides.  · There are two types of IUDs (intrauterine devices). An IUD can be put in a woman's uterus to prevent pregnancy for 3-5 years.  · Permanent sterilization can be done through a procedure for males, females, or both.  · Natural family planning methods involve not having sex on days when the woman could become pregnant.  This information is not intended to replace advice given to you by your health care provider. Make sure you discuss any questions you have with your health care provider.  Document Released: 01/27/2005 Document Revised: 01/29/2017 Document Reviewed: 03/01/2016  Elsevier Interactive Patient Education © 2019 [employer]

## 2018-01-28 NOTE — Progress Notes (Addendum)
Alexandra Short is a 27 y.o. G3P2002 at 4979w3d here for routine follow up.  She reports no vaginal bleeding, no contractions, no loss of fluid, and does report fetal movement.  She paid for an ultrasound on her own and knows that the baby is a girl, but she does still need an anatomy ultrasound.  She takes her prenatal vitamins when she remembers every now and then and has cut her cigarette smoking down to 1 to 2 cigarettes/day.  She is interested in progesterone only pills for contraception after the birth.   See flow sheet for details.  FHT: 143 bpm Fundal height: 22 cm  A/P: Pregnancy at 7679w3d.  Doing well.   Pregnancy issues include tobacco use, unstable social situation with incarceration during pregnancy, infrequent prenatal vitamin use. Due to recent incarceration, patient is out of the window for quad screen or integrated screen. Anatomy scan scheduled today. At next visit, will need 1 hour GTT and Tdap vaccine.  Will also need PMH form. Preterm labor precautions reviewed.  Encouraged to completely stop smoking, although she was congratulated on her progress. Follow up 4 weeks in [facility] clinic.

## 2018-01-29 ENCOUNTER — Encounter (HOSPITAL_COMMUNITY): Payer: Self-pay

## 2018-02-05 ENCOUNTER — Ambulatory Visit (HOSPITAL_COMMUNITY)
Admission: RE | Admit: 2018-02-05 | Discharge: 2018-02-05 | Disposition: A | Discharge status: Home / Self Care | Payer: Medicaid Other | Source: Ambulatory Visit | Attending: Family Medicine | Admitting: Family Medicine

## 2018-02-05 DIAGNOSIS — Z363 Encounter for antenatal screening for malformations: Secondary | ICD-10-CM

## 2018-02-05 DIAGNOSIS — Z3A24 24 weeks gestation of pregnancy: Secondary | ICD-10-CM | POA: Insufficient documentation

## 2018-02-05 DIAGNOSIS — Z3482 Encounter for supervision of other normal pregnancy, second trimester: Secondary | ICD-10-CM | POA: Insufficient documentation

## 2018-02-05 DIAGNOSIS — O99332 Smoking (tobacco) complicating pregnancy, second trimester: Secondary | ICD-10-CM | POA: Diagnosis not present

## 2018-02-08 ENCOUNTER — Other Ambulatory Visit (HOSPITAL_COMMUNITY): Payer: Self-pay | Admitting: *Deleted

## 2018-02-08 DIAGNOSIS — Z362 Encounter for other antenatal screening follow-up: Secondary | ICD-10-CM

## 2018-02-10 NOTE — L&D Delivery Note (Signed)
Delivery Note At 3:51 PM a viable female was delivered via Vaginal, Spontaneous (Presentation:LOA, loose nuchal, reduced  ).  APGAR: 9, 9; weight pending.  After 1 minute, the cord was clamped and cut. 40 units of pitocin diluted in 1000cc LR was infused rapidly IV.  The placenta separated spontaneously and delivered via CCT and maternal pushing effort.  It was inspected and appears to be intact with a 3 VC  Anesthesia:  epidural Episiotomy: None Lacerations: None Suture Repair:  Est. Blood Loss (mL): 100  Mom to postpartum.  Baby to Couplet care / Skin to Skin.  Alexandra Short 05/29/2018, 4:20 PM  .

## 2018-03-05 ENCOUNTER — Ambulatory Visit (INDEPENDENT_AMBULATORY_CARE_PROVIDER_SITE_OTHER): Payer: Medicaid Other | Admitting: Family Medicine

## 2018-03-05 ENCOUNTER — Ambulatory Visit (HOSPITAL_COMMUNITY): Payer: Medicaid Other

## 2018-03-05 ENCOUNTER — Other Ambulatory Visit: Payer: Self-pay

## 2018-03-05 VITALS — BP 96/54 | HR 94 | Temp 98.1°F | Wt 166.2 lb

## 2018-03-05 DIAGNOSIS — Z23 Encounter for immunization: Secondary | ICD-10-CM | POA: Diagnosis not present

## 2018-03-05 DIAGNOSIS — Z3482 Encounter for supervision of other normal pregnancy, second trimester: Secondary | ICD-10-CM

## 2018-03-05 LAB — POCT 1 HR PRENATAL GLUCOSE: Glucose 1 Hr Prenatal, POC: 85 mg/dL

## 2018-03-05 NOTE — Patient Instructions (Addendum)
It was nice seeing you today Ms. Ping!  Today, we are doing your sugar test, and I will let you know if this test is abnormal.  We would like to see you back in about 4 weeks in our OB clinic, where a faculty member will make sure that all of your prenatal care is in good shape.  We are rescheduling your second anatomy scan and will let you know what that appointment date and time is.  Good luck continuing on weaning down your cigarette use.  If you have any questions or concerns, please feel free to call the clinic.   Be well,  Dr. Frances Furbish

## 2018-03-05 NOTE — Progress Notes (Signed)
Alexandra Short is a 28 y.o. G3P2002 at [redacted]w[redacted]d here for routine follow up.  She reports no vaginal bleeding, no loss of fluid, no cramping or contractions.  Pregnancy issues include continued daily marijuana use, smoking about two cigarettes per day. See flow sheet for details.  Fundal height: 28 cm FHT: 148 bpm  A/P: Pregnancy at [redacted]w[redacted]d.  Doing well.    Infant feeding choice: bottle feed Contraception choice: POPs vs depo provera Infant circumcision desired not applicable  Tdap was given today. 1 hour glucola, CBC, RPR, and HIV were done today.   Pregnancy medical home and PHQ-9 forms were done today and reviewed.   Rh status was reviewed and patient does not need Rhogam.  Counseled patient on reducing THC and cigarette smoking, with the goal of complete abstinence of the substances if possible.  Preterm labor and fetal movement precautions reviewed. Follow up 2 weeks.

## 2018-03-06 LAB — HIV ANTIBODY (ROUTINE TESTING W REFLEX): HIV Screen 4th Generation wRfx: NONREACTIVE

## 2018-03-06 LAB — RPR: RPR: NONREACTIVE

## 2018-03-06 LAB — CBC
HEMATOCRIT: 30.9 % — AB (ref 34.0–46.6)
HEMOGLOBIN: 10.3 g/dL — AB (ref 11.1–15.9)
MCH: 28.6 pg (ref 26.6–33.0)
MCHC: 33.3 g/dL (ref 31.5–35.7)
MCV: 86 fL (ref 79–97)
Platelets: 249 10*3/uL (ref 150–450)
RBC: 3.6 x10E6/uL — ABNORMAL LOW (ref 3.77–5.28)
RDW: 13.2 % (ref 11.7–15.4)
WBC: 14.3 10*3/uL — ABNORMAL HIGH (ref 3.4–10.8)

## 2018-03-09 ENCOUNTER — Ambulatory Visit (HOSPITAL_COMMUNITY)
Admission: RE | Admit: 2018-03-09 | Discharge: 2018-03-09 | Disposition: A | Discharge status: Home / Self Care | Payer: Medicaid Other | Source: Ambulatory Visit | Attending: Family Medicine | Admitting: Family Medicine

## 2018-03-09 DIAGNOSIS — Z362 Encounter for other antenatal screening follow-up: Secondary | ICD-10-CM

## 2018-03-09 DIAGNOSIS — Z3A29 29 weeks gestation of pregnancy: Secondary | ICD-10-CM | POA: Diagnosis not present

## 2018-03-09 DIAGNOSIS — O99333 Smoking (tobacco) complicating pregnancy, third trimester: Secondary | ICD-10-CM

## 2018-04-07 ENCOUNTER — Encounter: Payer: Medicaid Other | Admitting: Family Medicine

## 2018-04-19 ENCOUNTER — Encounter: Payer: Medicaid Other | Admitting: Family Medicine

## 2018-04-28 ENCOUNTER — Ambulatory Visit (INDEPENDENT_AMBULATORY_CARE_PROVIDER_SITE_OTHER): Payer: Medicaid Other | Admitting: Family Medicine

## 2018-04-28 ENCOUNTER — Other Ambulatory Visit (HOSPITAL_COMMUNITY)
Admission: RE | Admit: 2018-04-28 | Discharge: 2018-04-28 | Disposition: A | Discharge status: Home / Self Care | Payer: Medicaid Other | Source: Ambulatory Visit | Attending: Family Medicine | Admitting: Family Medicine

## 2018-04-28 ENCOUNTER — Other Ambulatory Visit: Payer: Self-pay

## 2018-04-28 ENCOUNTER — Encounter: Payer: Self-pay | Admitting: Family Medicine

## 2018-04-28 VITALS — BP 102/60 | HR 92 | Temp 98.7°F | Wt 162.0 lb

## 2018-04-28 DIAGNOSIS — Z3483 Encounter for supervision of other normal pregnancy, third trimester: Secondary | ICD-10-CM | POA: Diagnosis not present

## 2018-04-28 LAB — OB RESULTS CONSOLE GBS: GBS: NEGATIVE

## 2018-04-28 LAB — OB RESULTS CONSOLE GC/CHLAMYDIA: Gonorrhea: NEGATIVE

## 2018-04-28 NOTE — Patient Instructions (Signed)
Third Trimester of Pregnancy The third trimester is from week 28 through week 40 (months 7 through 9). The third trimester is a time when the unborn baby (fetus) is growing rapidly. At the end of the ninth month, the fetus is about 20 inches in length and weighs 6-10 pounds. Body changes during your third trimester Your body will continue to go through many changes during pregnancy. The changes vary from woman to woman. During the third trimester:  Your weight will continue to increase. You can expect to gain 25-35 pounds (11-16 kg) by the end of the pregnancy.  You may begin to get stretch marks on your hips, abdomen, and breasts.  You may urinate more often because the fetus is moving lower into your pelvis and pressing on your bladder.  You may develop or continue to have heartburn. This is caused by increased hormones that slow down muscles in the digestive tract.  You may develop or continue to have constipation because increased hormones slow digestion and cause the muscles that push waste through your intestines to relax.  You may develop hemorrhoids. These are swollen veins (varicose veins) in the rectum that can itch or be painful.  You may develop swollen, bulging veins (varicose veins) in your legs.  You may have increased body aches in the pelvis, back, or thighs. This is due to weight gain and increased hormones that are relaxing your joints.  You may have changes in your hair. These can include thickening of your hair, rapid growth, and changes in texture. Some women also have hair loss during or after pregnancy, or hair that feels dry or thin. Your hair will most likely return to normal after your baby is born.  Your breasts will continue to grow and they will continue to become tender. A yellow fluid (colostrum) may leak from your breasts. This is the first milk you are producing for your baby.  Your belly button may stick out.  You may notice more swelling in your hands,  face, or ankles.  You may have increased tingling or numbness in your hands, arms, and legs. The skin on your belly may also feel numb.  You may feel short of breath because of your expanding uterus.  You may have more problems sleeping. This can be caused by the size of your belly, increased need to urinate, and an increase in your body's metabolism.  You may notice the fetus "dropping," or moving lower in your abdomen (lightening).  You may have increased vaginal discharge.  You may notice your joints feel loose and you may have pain around your pelvic bone. What to expect at prenatal visits You will have prenatal exams every 2 weeks until week 36. Then you will have weekly prenatal exams. During a routine prenatal visit:  You will be weighed to make sure you and the baby are growing normally.  Your blood pressure will be taken.  Your abdomen will be measured to track your baby's growth.  The fetal heartbeat will be listened to.  Any test results from the previous visit will be discussed.  You may have a cervical check near your due date to see if your cervix has softened or thinned (effaced).  You will be tested for Group B streptococcus. This happens between 35 and 37 weeks. Your health care provider may ask you:  What your birth plan is.  How you are feeling.  If you are feeling the baby move.  If you have had any abnormal   symptoms, such as leaking fluid, bleeding, severe headaches, or abdominal cramping.  If you are using any tobacco products, including cigarettes, chewing tobacco, and electronic cigarettes.  If you have any questions. Other tests or screenings that may be performed during your third trimester include:  Blood tests that check for low iron levels (anemia).  Fetal testing to check the health, activity level, and growth of the fetus. Testing is done if you have certain medical conditions or if there are problems during the pregnancy.  Nonstress test  (NST). This test checks the health of your baby to make sure there are no signs of problems, such as the baby not getting enough oxygen. During this test, a belt is placed around your belly. The baby is made to move, and its heart rate is monitored during movement. What is false labor? False labor is a condition in which you feel small, irregular tightenings of the muscles in the womb (contractions) that usually go away with rest, changing position, or drinking water. These are called Braxton Hicks contractions. Contractions may last for hours, days, or even weeks before true labor sets in. If contractions come at regular intervals, become more frequent, increase in intensity, or become painful, you should see your health care provider. What are the signs of labor?  Abdominal cramps.  Regular contractions that start at 10 minutes apart and become stronger and more frequent with time.  Contractions that start on the top of the uterus and spread down to the lower abdomen and back.  Increased pelvic pressure and dull back pain.  A watery or bloody mucus discharge that comes from the vagina.  Leaking of amniotic fluid. This is also known as your "water breaking." It could be a slow trickle or a gush. Let your health care provider know if it has a color or strange odor. If you have any of these signs, call your health care provider right away, even if it is before your due date. Follow these instructions at home: Medicines  Follow your health care provider's instructions regarding medicine use. Specific medicines may be either safe or unsafe to take during pregnancy.  Take a prenatal vitamin that contains at least 600 micrograms (mcg) of folic acid.  If you develop constipation, try taking a stool softener if your health care provider approves. Eating and drinking   Eat a balanced diet that includes fresh fruits and vegetables, whole grains, good sources of protein such as meat, eggs, or tofu,  and low-fat dairy. Your health care provider will help you determine the amount of weight gain that is right for you.  Avoid raw meat and uncooked cheese. These carry germs that can cause birth defects in the baby.  If you have low calcium intake from food, talk to your health care provider about whether you should take a daily calcium supplement.  Eat four or five small meals rather than three large meals a day.  Limit foods that are high in fat and processed sugars, such as fried and sweet foods.  To prevent constipation: ? Drink enough fluid to keep your urine clear or pale yellow. ? Eat foods that are high in fiber, such as fresh fruits and vegetables, whole grains, and beans. Activity  Exercise only as directed by your health care provider. Most women can continue their usual exercise routine during pregnancy. Try to exercise for 30 minutes at least 5 days a week. Stop exercising if you experience uterine contractions.  Avoid heavy lifting.  Do   not exercise in extreme heat or humidity, or at high altitudes.  Wear low-heel, comfortable shoes.  Practice good posture.  You may continue to have sex unless your health care provider tells you otherwise. Relieving pain and discomfort  Take frequent breaks and rest with your legs elevated if you have leg cramps or low back pain.  Take warm sitz baths to soothe any pain or discomfort caused by hemorrhoids. Use hemorrhoid cream if your health care provider approves.  Wear a good support bra to prevent discomfort from breast tenderness.  If you develop varicose veins: ? Wear support pantyhose or compression stockings as told by your healthcare provider. ? Elevate your feet for 15 minutes, 3-4 times a day. Prenatal care  Write down your questions. Take them to your prenatal visits.  Keep all your prenatal visits as told by your health care provider. This is important. Safety  Wear your seat belt at all times when driving.  Make  a list of emergency phone numbers, including numbers for family, friends, the hospital, and police and fire departments. General instructions  Avoid cat litter boxes and soil used by cats. These carry germs that can cause birth defects in the baby. If you have a cat, ask someone to clean the litter box for you.  Do not travel far distances unless it is absolutely necessary and only with the approval of your health care provider.  Do not use hot tubs, steam rooms, or saunas.  Do not drink alcohol.  Do not use any products that contain nicotine or tobacco, such as cigarettes and e-cigarettes. If you need help quitting, ask your health care provider.  Do not use any medicinal herbs or unprescribed drugs. These chemicals affect the formation and growth of the baby.  Do not douche or use tampons or scented sanitary pads.  Do not cross your legs for long periods of time.  To prepare for the arrival of your baby: ? Take prenatal classes to understand, practice, and ask questions about labor and delivery. ? Make a trial run to the hospital. ? Visit the hospital and tour the maternity area. ? Arrange for maternity or paternity leave through employers. ? Arrange for family and friends to take care of pets while you are in the hospital. ? Purchase a rear-facing car seat and make sure you know how to install it in your car. ? Pack your hospital bag. ? Prepare the baby's nursery. Make sure to remove all pillows and stuffed animals from the baby's crib to prevent suffocation.  Visit your dentist if you have not gone during your pregnancy. Use a soft toothbrush to brush your teeth and be gentle when you floss. Contact a health care provider if:  You are unsure if you are in labor or if your water has broken.  You become dizzy.  You have mild pelvic cramps, pelvic pressure, or nagging pain in your abdominal area.  You have lower back pain.  You have persistent nausea, vomiting, or  diarrhea.  You have an unusual or bad smelling vaginal discharge.  You have pain when you urinate. Get help right away if:  Your water breaks before 37 weeks.  You have regular contractions less than 5 minutes apart before 37 weeks.  You have a fever.  You are leaking fluid from your vagina.  You have spotting or bleeding from your vagina.  You have severe abdominal pain or cramping.  You have rapid weight loss or weight gain.  You have   shortness of breath with chest pain.  You notice sudden or extreme swelling of your face, hands, ankles, feet, or legs.  Your baby makes fewer than 10 movements in 2 hours.  You have severe headaches that do not go away when you take medicine.  You have vision changes. Summary  The third trimester is from week 28 through week 40, months 7 through 9. The third trimester is a time when the unborn baby (fetus) is growing rapidly.  During the third trimester, your discomfort may increase as you and your baby continue to gain weight. You may have abdominal, leg, and back pain, sleeping problems, and an increased need to urinate.  During the third trimester your breasts will keep growing and they will continue to become tender. A yellow fluid (colostrum) may leak from your breasts. This is the first milk you are producing for your baby.  False labor is a condition in which you feel small, irregular tightenings of the muscles in the womb (contractions) that eventually go away. These are called Braxton Hicks contractions. Contractions may last for hours, days, or even weeks before true labor sets in.  Signs of labor can include: abdominal cramps; regular contractions that start at 10 minutes apart and become stronger and more frequent with time; watery or bloody mucus discharge that comes from the vagina; increased pelvic pressure and dull back pain; and leaking of amniotic fluid. This information is not intended to replace advice given to you by your  health care provider. Make sure you discuss any questions you have with your health care provider. Document Released: 01/21/2001 Document Revised: 03/04/2016 Document Reviewed: 03/04/2016 Elsevier Interactive Patient Education  2019 [employer]  

## 2018-04-28 NOTE — Progress Notes (Signed)
  Subjective:    Alexandra Short is a 28 y.o. [id number] [redacted]w[redacted]d being seen today for her obstetrical visit.  Patient reports no bleeding, no contractions, no cramping and no leaking. Fetal movement: normal. Patient reports that she stopped smoking cigarettes and Marijuana  Objective:    BP 102/60   Pulse 92   Temp 98.7 F (37.1 C)   Wt 162 lb (73.5 kg)   LMP 08/17/2017   BMI 23.92 kg/m   Physical Exam  Constitutional: She is oriented to person, place, and time. She appears well-developed and well-nourished.  HENT:  Head: Normocephalic.  Cardiovascular: Normal rate.  Respiratory: Effort normal.  GI: Soft.  Gravid  Genitourinary:    Vagina and uterus normal.   Musculoskeletal: Normal range of motion.  Neurological: She is alert and oriented to person, place, and time.  Skin: Skin is warm and dry.  Psychiatric: She has a normal mood and affect.    Exam  FHT: Fetal Heart Rate (bpm): 152  Uterine Size: Fundal Height: 31 cm  Presentation:       Assessment:    Pregnancy:  [id number]. No acute complaints today. Doing well. Patient was measuring small for gestational age. Patient has a history of IUGR in G1.    Plan:    Patient Active Problem List   Diagnosis Date Noted  . Abnormal Pap smear of cervix 01/28/2018  . Supervision of normal pregnancy 10/07/2017  . Screening for STDs (sexually transmitted diseases) 07/07/2017  . Healthcare maintenance 07/07/2017  . 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 Generalized anxiety disorder 06/29/2017  . Tobacco use disorder 03/07/2010   Infant feeding choice: bottle feed Contraception choice: POPs vs depo provera Infant circumcision desired not applicable  GC/CH and GBS collected today.  Patient has been scheduled for Growth U/S on 3/25  Follow up in 2 Weeks.

## 2018-05-01 LAB — CERVICOVAGINAL ANCILLARY ONLY
CHLAMYDIA, DNA PROBE: NEGATIVE
Neisseria Gonorrhea: NEGATIVE

## 2018-05-03 LAB — CULTURE, BETA STREP (GROUP B ONLY): Strep Gp B Culture: NEGATIVE

## 2018-05-05 ENCOUNTER — Ambulatory Visit (HOSPITAL_COMMUNITY)
Admission: RE | Admit: 2018-05-05 | Discharge: 2018-05-05 | Disposition: A | Discharge status: Home / Self Care | Payer: Medicaid Other | Source: Ambulatory Visit | Attending: Family Medicine | Admitting: Family Medicine

## 2018-05-05 ENCOUNTER — Encounter (HOSPITAL_COMMUNITY): Payer: Self-pay

## 2018-05-05 ENCOUNTER — Ambulatory Visit (HOSPITAL_COMMUNITY): Payer: Medicaid Other | Admitting: *Deleted

## 2018-05-05 ENCOUNTER — Other Ambulatory Visit: Payer: Self-pay

## 2018-05-05 VITALS — BP 122/76 | HR 109 | Temp 97.6°F

## 2018-05-05 DIAGNOSIS — O09293 Supervision of pregnancy with other poor reproductive or obstetric history, third trimester: Secondary | ICD-10-CM

## 2018-05-05 DIAGNOSIS — Z362 Encounter for other antenatal screening follow-up: Secondary | ICD-10-CM | POA: Diagnosis not present

## 2018-05-05 DIAGNOSIS — O99333 Smoking (tobacco) complicating pregnancy, third trimester: Secondary | ICD-10-CM

## 2018-05-05 DIAGNOSIS — Z3A37 37 weeks gestation of pregnancy: Secondary | ICD-10-CM

## 2018-05-05 DIAGNOSIS — Z3483 Encounter for supervision of other normal pregnancy, third trimester: Secondary | ICD-10-CM | POA: Diagnosis present

## 2018-05-05 DIAGNOSIS — O26843 Uterine size-date discrepancy, third trimester: Secondary | ICD-10-CM | POA: Diagnosis not present

## 2018-05-05 DIAGNOSIS — O0990 Supervision of high risk pregnancy, unspecified, unspecified trimester: Secondary | ICD-10-CM | POA: Insufficient documentation

## 2018-05-11 ENCOUNTER — Encounter: Payer: Medicaid Other | Admitting: Family Medicine

## 2018-05-13 ENCOUNTER — Other Ambulatory Visit: Payer: Self-pay

## 2018-05-13 ENCOUNTER — Ambulatory Visit (INDEPENDENT_AMBULATORY_CARE_PROVIDER_SITE_OTHER): Payer: Medicaid Other | Admitting: Family Medicine

## 2018-05-13 VITALS — BP 102/62 | HR 102 | Temp 98.6°F | Wt 162.8 lb

## 2018-05-13 DIAGNOSIS — Z3483 Encounter for supervision of other normal pregnancy, third trimester: Secondary | ICD-10-CM

## 2018-05-13 NOTE — Progress Notes (Signed)
Alexandra Short is a 28 y.o. G3P2002 at [redacted]w[redacted]d here for routine follow up.  She reports good fetal movement, no vaginal bleeding, no loss of fluid. She is having Braxton-Hicks contractions. See flow sheet for details.  Physical Exam: Gen: NAD Resp: NWOB, CTAB Cardio: RRR, no murmurs or gallops, normal S1, S2 GI: Gravid abdomen FHR: 138-155 Fundal Height: 37cm  A/P: Pregnancy at [redacted]w[redacted]d. Doing well.   Pregnancy issues include smoker with history of IUGR in previous pregnancy.  Infant feeding choice: bottle/formula feed Contraception choice: POPs vs Depo shot Infant circumcision desired: n/a, baby is a girl  GBS and gc/chlamydia testing results were reviewed today and were negative from 3/18. She has a history of SGA in previous pregnancy and some concern this pregnancy however last U/S from 3/25 showed fetal growth appropriate for age at 89th percentile. Labor and fetal movement precautions reviewed. Follow up 1 week. If patient reaches 40 weeks please schedule her for induction.

## 2018-05-13 NOTE — Patient Instructions (Signed)

## 2018-05-20 ENCOUNTER — Other Ambulatory Visit: Payer: Self-pay

## 2018-05-20 ENCOUNTER — Ambulatory Visit (INDEPENDENT_AMBULATORY_CARE_PROVIDER_SITE_OTHER): Payer: Medicaid Other | Admitting: Family Medicine

## 2018-05-20 VITALS — BP 108/60 | HR 107 | Temp 98.5°F | Wt 161.0 lb

## 2018-05-20 DIAGNOSIS — Z3483 Encounter for supervision of other normal pregnancy, third trimester: Secondary | ICD-10-CM

## 2018-05-20 NOTE — Progress Notes (Signed)
  Return OB - Clinic Visit  Patient ID: [id number]   Date of birth: 06/06/1990 Date of Visit: 05/20/2018 PCP: Lennox Solders, MD Subjective:  CC: Prenatal Care  HPI: Alexandra Short is a 28 y.o. G3P2002 at [redacted]w[redacted]d dated by LMP who presents to clinic for routine follow up.  She reports some mild cramping, but not consistent contractions. She denies fluid leakage, vaginal bleeding. She reports fetal movement. She is taking prenatal vitamins.  Patient otherwise has no complaints today.   Review of Symptoms: Per HPI I have reviewed the patient's medical, surgical, family, and social history as appropriate.   Pertinent PMH: Tobacco use during pregnancy, marijuana use during pregnancy  Prior Obstetric History: Term SVD x 2. IOL for poor weight gain per patient  Complications of Current Pregnancy: Abnormal 1 hour glucose test, normal 3 hour, Tobacco use during pregnancy, marijuana use during pregnancy, second trimester care (despite confirmation of pregnancy).    Objective:  Physical Exam:  BP 108/60   Pulse (!) 107   Temp 98.5 F (36.9 C)   Wt 161 lb (73 kg)   LMP 08/17/2017   BMI 23.78 kg/m  Gen: appears well  Fetal HR: 143 Fundal height: 36  Vertex confirmed on ultrasound   Pertinent Labs & Imaging:  Reviewed in chart   Assessment & Plan:  A/P: Pregnancy at [redacted]w[redacted]d.  Doing well.  S<D.  [facility]- Called to schedule, office moving this weekend and to open again on the 13th. They will call patient to schedule   SW consult in mother/baby   Pt will need close f/u for depression, high risk   Anticipatory Guidance and Prenatal Education provided on the following topics: - Reasons to present to MAU - Nutrition in pregnancy - Safe sleep for infant  - Contraception postpartum: OCP - Breastfeeding: No   - GBS/GC/CZ results were reviewed today.    Follow-up: 1 week - if no labor, schedule induction   Precepted with Dr. Joette Catching, M.D. [facility]  PGY  -1 05/20/2018, 12:12 PM

## 2018-05-20 NOTE — Patient Instructions (Addendum)
Dear Alexandra Short,   It was very nice to see you! Thank you for taking your time to come in to be seen. Today, we discussed the following:   Prenatal Care    [facility]) for a BPP (ultrasound) in one week. Their office is currently closed until April 13th. They will call you early next week to make an appointment.   Please follow up in 1 week   Future Appointments  Date Time Provider Department Center  05/27/2018 11:15 AM ACCESS TO CARE POOL [facility]     Be well,   Dr. Genia Hotter [facility] [phone]   Pain Relief During Labor and Delivery Many things can cause pain during labor and delivery, including:  Pressure on bones and ligaments due to the baby moving through the pelvis.  Stretching of tissues due to the baby moving through the birth canal.  Muscle tension due to anxiety or nervousness.  The uterus tightening (contracting) and relaxing to help move the baby. There are many ways to deal with the pain of labor and delivery. They include:  Taking prenatal classes. Taking these classes helps you know what to expect during your baby's birth. What you learn will increase your confidence and decrease your anxiety.  Practicing relaxation techniques or doing relaxing activities, such as: ? Focused breathing. ? Meditation. ? Visualization. ? Aroma therapy. ? Listening to your favorite music. ? Hypnosis.  Taking a warm shower or bath (hydrotherapy). This may: ? Provide comfort and relaxation. ? Lessen your perception of pain. ? Decrease the amount of pain medicine needed. ? Decrease the length of labor.  Getting a massage or counterpressure on your back.  Applying warm packs or ice packs.  Changing positions often, moving around, or using a birthing ball.  Getting: ? Pain medicine through an IV or injection into a muscle. ? Pain medicine inserted into your spinal  column. ? Injections of sterile water just under the skin on your lower back (intradermal injections). ? Laughing gas (nitrous oxide). Discuss your pain control options with your health care provider during your prenatal visits. Explore the options offered by your hospital or birth center. What kinds of medicine are available? There are two kinds of medicines that can be used to relieve pain during labor and delivery:  Analgesics. These medicines decrease pain without causing you to lose feeling or the ability to move your muscles.  Anesthetics. These medicines block feeling in the body and can decrease your ability to move freely. Both of these kinds of medicine can cause minor side effects, such as nausea, trouble concentrating, and sleepiness. They can also decrease the baby's heart rate before birth and affect the baby's breathing rate after birth. For this reason, health care providers are careful about when and how much medicine is given. What are specific medicines and procedures that provide pain relief? Local Anesthetics Local anesthetics are used to numb a small area of the body. They may be used along with another kind of anesthetic or used to numb the nerves of the vagina, cervix, and perineum during the second stage of labor. General Anesthetics General anesthetics cause you to lose consciousness so you do not feel pain. They are usually only used for an emergency cesarean delivery. General anesthetics are given through an IV tube and a mask. Pudendal Block A pudendal block is a form of local anesthetic. It may be used to relieve the pain associated  with pushing or stretching of the perineum at the time of delivery or to further numb the perineum. A pudendal block is done by injecting numbing medicine through the vaginal wall into a nerve in the pelvis. Epidural Analgesia Epidural analgesia is given through a flexible IV catheter that is inserted into the lower back. Numbing medicine  is delivered continuously to the area near your spinal column nerves (epidural space). After having this type of analgesia, you may be able to move your legs but you most likely will not be able to walk. Depending on the amount of medicine given, you may lose all feeling in the lower half of your body, or you may retain some level of sensation, including the urge to push. Epidural analgesia can be used to provide pain relief for a vaginal birth. Spinal Block A spinal block is similar to epidural analgesia, but the medicine is injected into the spinal fluid instead of the epidural space. A spinal block is only given once. It starts to relieve pain quickly, but the pain relief lasts only 1-6 hours. Spinal blocks can be used for cesarean deliveries. Combined Spinal-Epidural (CSE) Block A CSE block combines the effects of a spinal block and epidural analgesia. The spinal block works quickly to block all pain. The epidural analgesia provides continuous pain relief, even after the effects of the spinal block have worn off. This information is not intended to replace advice given to you by your health care provider. Make sure you discuss any questions you have with your health care provider. Document Released: 05/15/2008 Document Revised: 07/06/2015 Document Reviewed: 06/20/2015 Elsevier Interactive Patient Education  2019 [employer].  Before Baby Comes Home Once your baby is home with you, things may become a bit hectic as you map out a schedule around your newborn's patterns. Preparing the things you need at home before that time comes is important. Before your baby arrives, make sure you:  Have all the supplies that you will need to care for your baby.  Know where to go if there is an emergency.  Discuss the baby's arrival with other family members. What supplies will I need? Having the following supplies ready before your baby arrives will help ensure that you are prepared: Large items  Crib or  bassinet and mattress. Make sure to follow safe sleep recommendations to reduce the risk of sudden infant death syndrome.  Rear-facing infant car seat. Have a trained professional check to make sure that it is installed in your car correctly. Many hospitals and fire departments perform this service free of charge.  Stroller. Always make sure any products-including cribs, mattresses, bassinets, or portable cribs and play areas-are safe. Check for recalls on your specific brand and model of crib. Breastfeeding  Nursing pillow.  Milk storage containers or bags.  Nipple cream.  Nursing bra.  Breast pads.  Breast pump.  Breast shields. Feeding  Formula.  Purified bottled water.  6-8 bottles (4-5 oz bottles and 8-9 oz bottles).  6-8 bottle nipples.  Bibs and burp cloths.  Bottle brush.  Bottle sterilizer (or a pot with a lid). Bathing  Infant bath basin.  Mild baby soap and baby shampoo.  Soft cloth towel and washcloth.  Hooded towel. Diapering  Diapers. You may need to use as many as 10-12 diapers each day.  Baby wipes.  Diaper cream.  Petroleum jelly.  Changing pad.  Hand sanitizer. Health and safety  Rectal thermometer.  Infant medicines.  Bulb syringe.  Baby nail clippers.  Baby monitor.  2-3 pacifiers, if desired. Sleeping  Sleep sack or swaddling blanket.  Firm mattress pad and fitted sheets for the crib or bassinet. Other supplies  Diaper bag.  Clothing, including one-piece outfits and pajamas.  Receiving blankets. Follow these instructions at home: Preparing for an emergency Prepare for an emergency by taking these steps:  Know when to seek care or call your health care provider.  Know how to get to the nearest hospital.  List the phone numbers of your baby's health care providers near your home phone and in your cell phone.  Take an infant first aid and CPR class.  Place the phone number for the poison control center on  your refrigerator.  If there will be caregivers in the home, make sure your phone number, emergency contacts, and address are placed on the refrigerator in case they need to be given to emergency services. Preparing your family   Create a plan for visitors. Keep your baby away from people who have a cough, fever, or other symptoms of illness.  Prepare freezer meals ahead of time, and ask friends and family to help with meal preparation, errands, and everyday tasks.  If you have other children: ? Talk with them about the baby coming home. Ask them how they feel about it. ? Read a book together about being a new big brother or sister. ? Find ways to let them help you prepare for the new baby. ? Have someone ready to care for them while you are in the hospital. Where to find more information  Consumer Product Safety Commission: http://johnston-ramirez.com/www.cpsc.gov  American Academy of Pediatrics: www.healthychildren.org  Safe Kids Worldwide: www.safekids.org Summary  Planning is important before bringing your baby home from the hospital. You will need to have certain supplies ready before your baby arrives.  You will need to have a rear-facing infant car seat ready prior to bringing your baby home. Have a trained professional check to make sure that it is installed in your car correctly.  Always make sure any products-including cribs, mattresses, bassinets, or portable cribs and play areas-are safe. Check for recalls on your specific brand and model of crib.  Know when to seek care or call your health care provider, and know how to get to the nearest hospital. This information is not intended to replace advice given to you by your health care provider. Make sure you discuss any questions you have with your health care provider. Document Released: 01/10/2008 Document Revised: 12/17/2016 Document Reviewed: 12/17/2016 Elsevier Interactive Patient Education  2019 [employer]

## 2018-05-24 ENCOUNTER — Encounter (HOSPITAL_COMMUNITY): Payer: Self-pay | Admitting: *Deleted

## 2018-05-24 ENCOUNTER — Inpatient Hospital Stay (HOSPITAL_COMMUNITY)
Admission: AD | Admit: 2018-05-24 | Discharge: 2018-05-24 | Disposition: A | Discharge status: Home / Self Care | Payer: Medicaid Other | Source: Ambulatory Visit | Attending: Obstetrics & Gynecology | Admitting: Obstetrics & Gynecology

## 2018-05-24 ENCOUNTER — Other Ambulatory Visit: Payer: Self-pay

## 2018-05-24 DIAGNOSIS — O471 False labor at or after 37 completed weeks of gestation: Secondary | ICD-10-CM | POA: Diagnosis present

## 2018-05-24 DIAGNOSIS — O479 False labor, unspecified: Secondary | ICD-10-CM

## 2018-05-24 NOTE — MAU Provider Note (Signed)
Patient here for labor check. Cervix remained unchanged at 1 cm/thick on serial exams. NST reactive. Patient will be 41 weeks on 4/20 and does not have induction scheduled. Discussed with nurse secretary. Patient to return for induction at 8 am on 4/20 for postdates.   Marcy Siren, D.O. [facility] Family [facility] Fellow, [facility] for [facility], [facility] 05/24/2018, 3:39 PM

## 2018-05-24 NOTE — Discharge Instructions (Signed)

## 2018-05-24 NOTE — MAU Note (Signed)
Pt presents to MAU with complaints of contractions all day. Denies any VB or LOF. +FM

## 2018-05-27 ENCOUNTER — Ambulatory Visit (HOSPITAL_COMMUNITY)
Admission: RE | Admit: 2018-05-27 | Discharge: 2018-05-27 | Disposition: A | Discharge status: Home / Self Care | Payer: Medicaid Other | Source: Ambulatory Visit | Attending: Family Medicine | Admitting: Family Medicine

## 2018-05-27 ENCOUNTER — Encounter (HOSPITAL_COMMUNITY): Payer: Self-pay

## 2018-05-27 ENCOUNTER — Ambulatory Visit (INDEPENDENT_AMBULATORY_CARE_PROVIDER_SITE_OTHER): Payer: Medicaid Other | Admitting: Student in an Organized Health Care Education/Training Program

## 2018-05-27 ENCOUNTER — Ambulatory Visit (HOSPITAL_COMMUNITY): Payer: Medicaid Other | Admitting: *Deleted

## 2018-05-27 ENCOUNTER — Other Ambulatory Visit: Payer: Self-pay

## 2018-05-27 VITALS — Temp 98.7°F

## 2018-05-27 DIAGNOSIS — Z3483 Encounter for supervision of other normal pregnancy, third trimester: Secondary | ICD-10-CM | POA: Insufficient documentation

## 2018-05-27 DIAGNOSIS — O26843 Uterine size-date discrepancy, third trimester: Secondary | ICD-10-CM | POA: Diagnosis not present

## 2018-05-27 DIAGNOSIS — O283 Abnormal ultrasonic finding on antenatal screening of mother: Secondary | ICD-10-CM | POA: Diagnosis not present

## 2018-05-27 DIAGNOSIS — O99333 Smoking (tobacco) complicating pregnancy, third trimester: Secondary | ICD-10-CM | POA: Diagnosis not present

## 2018-05-27 DIAGNOSIS — O365930 Maternal care for other known or suspected poor fetal growth, third trimester, not applicable or unspecified: Secondary | ICD-10-CM

## 2018-05-27 DIAGNOSIS — Z3A40 40 weeks gestation of pregnancy: Secondary | ICD-10-CM | POA: Diagnosis not present

## 2018-05-27 DIAGNOSIS — O480 Post-term pregnancy: Secondary | ICD-10-CM

## 2018-05-27 NOTE — Progress Notes (Signed)
   Subjective:    Patient ID: Alexandra Short, female    DOB: December 10, 1990, 28 y.o.   MRN: [id number]   CC: pregnancy [redacted]w[redacted]d  HPI: Patient presents today for prenatal appointment. Patient is post-dates and ready to not be pregnant any more. She endorses frequent positive fetal movement, and infrequent contractions. She went to MAU Monday for contractions 8-9 minutes apart but they have spaced out now to just a few per day. She denies leakage of fluids or blood. She has had some mucous discharge.  Patient brings in paperwork with instructions for arriving to [facility] hospital for direct admit and induction on Monday.  She would like a cervical exam today to check her dilation.  Patient has not had BPP.   Smoking status reviewed   ROS: pertinent noted in the HPI   Past Medical History:  Diagnosis Date  . Postpartum depression 06/11/2010    No past surgical history on file.  Past medical history, surgical, family, and social history reviewed and updated in the EMR as appropriate.  Objective:  BP 108/62   Pulse 97   Temp 98.4 F (36.9 C)   Wt 162 lb 6.4 oz (73.7 kg)   LMP 08/17/2017   BMI 23.98 kg/m   Vitals and nursing note reviewed  General: NAD, pleasant, able to participate in exam Cardiac: RRR, S1 S2 present. normal heart sounds, no murmurs. Respiratory: CTAB, normal effort, No wheezes, rales or rhonchi Extremities: no edema or cyanosis. Skin: warm and dry, no rashes noted Neuro: alert, no obvious focal deficits Psych: Normal affect and mood Cervical: cervix was soft and thick with ~fingertip dilation. Posterior position. Infant station was high.  Assessment & Plan:    Supervision of normal pregnancy No complaints or concerns today BPP has not been completed. Reached out to MFM to schedule apt ASAP. Patient is scheduled for induction Monday. Cervical exam per patient request- soft & thick, fingertip dilation   Jamelle Rushing, DO [facility] PGY-1

## 2018-05-27 NOTE — Progress Notes (Signed)
obo

## 2018-05-27 NOTE — Procedures (Signed)
Alexandra Short 12/23/1990 [redacted]w[redacted]d  Fetus A Non-Stress Test Interpretation for 05/27/18  Indication: Unsatisfactory BPP  Fetal Heart Rate A Mode: External Baseline Rate (A): 145 bpm Variability: Moderate Accelerations: 15 x 15 Decelerations: None Multiple birth?: No  Uterine Activity Mode: Toco Contraction Frequency (min): irreg UC noted Contraction Duration (sec): 80-120 Contraction Quality: Mild Resting Tone Palpated: Relaxed Resting Time: Adequate  Interpretation (Fetal Testing) Nonstress Test Interpretation: Reactive Comments: FHR tracing rev'd by Dr. Judeth Cornfield

## 2018-05-27 NOTE — Assessment & Plan Note (Signed)
No complaints or concerns today BPP has not been completed. Reached out to MFM to schedule apt ASAP. Patient is scheduled for induction Monday. Cervical exam per patient request- soft & thick, fingertip dilation

## 2018-05-27 NOTE — Patient Instructions (Signed)
It was a pleasure to see you today!  To summarize our discussion for this visit: Your cervix is soft but still not dilated more than about a fingertip. You already have instructions for induction on Monday. If you have any other questions or concerns before that, please call back to the office or [facility].  Call the clinic at [phone] if your symptoms worsen or you have any concerns.  Thank you for allowing me to take part in your care,  Dr. Jamelle Rushing   Thanks for choosing [facility] for your primary care.

## 2018-05-29 ENCOUNTER — Inpatient Hospital Stay (HOSPITAL_COMMUNITY)
Admission: AD | Admit: 2018-05-29 | Discharge: 2018-05-30 | DRG: 806 | Disposition: A | Discharge status: Home / Self Care | Payer: Medicaid Other | Attending: Obstetrics and Gynecology | Admitting: Obstetrics and Gynecology

## 2018-05-29 ENCOUNTER — Inpatient Hospital Stay (HOSPITAL_COMMUNITY): Payer: Medicaid Other | Admitting: Anesthesiology

## 2018-05-29 ENCOUNTER — Other Ambulatory Visit: Payer: Self-pay

## 2018-05-29 ENCOUNTER — Encounter (HOSPITAL_COMMUNITY): Payer: Self-pay | Admitting: *Deleted

## 2018-05-29 DIAGNOSIS — F411 Generalized anxiety disorder: Secondary | ICD-10-CM | POA: Diagnosis present

## 2018-05-29 DIAGNOSIS — Z87891 Personal history of nicotine dependence: Secondary | ICD-10-CM

## 2018-05-29 DIAGNOSIS — O99334 Smoking (tobacco) complicating childbirth: Secondary | ICD-10-CM

## 2018-05-29 DIAGNOSIS — F17210 Nicotine dependence, cigarettes, uncomplicated: Secondary | ICD-10-CM

## 2018-05-29 DIAGNOSIS — O99324 Drug use complicating childbirth: Secondary | ICD-10-CM | POA: Diagnosis present

## 2018-05-29 DIAGNOSIS — O26893 Other specified pregnancy related conditions, third trimester: Secondary | ICD-10-CM | POA: Diagnosis present

## 2018-05-29 DIAGNOSIS — F1210 Cannabis abuse, uncomplicated: Secondary | ICD-10-CM

## 2018-05-29 DIAGNOSIS — F17200 Nicotine dependence, unspecified, uncomplicated: Secondary | ICD-10-CM | POA: Diagnosis present

## 2018-05-29 DIAGNOSIS — O99344 Other mental disorders complicating childbirth: Secondary | ICD-10-CM | POA: Diagnosis present

## 2018-05-29 DIAGNOSIS — F329 Major depressive disorder, single episode, unspecified: Secondary | ICD-10-CM | POA: Diagnosis present

## 2018-05-29 DIAGNOSIS — Z3A40 40 weeks gestation of pregnancy: Secondary | ICD-10-CM

## 2018-05-29 DIAGNOSIS — F330 Major depressive disorder, recurrent, mild: Secondary | ICD-10-CM | POA: Diagnosis present

## 2018-05-29 DIAGNOSIS — O480 Post-term pregnancy: Secondary | ICD-10-CM | POA: Diagnosis not present

## 2018-05-29 LAB — CBC
HCT: 34.7 % — ABNORMAL LOW (ref 36.0–46.0)
Hemoglobin: 11.3 g/dL — ABNORMAL LOW (ref 12.0–15.0)
MCH: 25.3 pg — ABNORMAL LOW (ref 26.0–34.0)
MCHC: 32.6 g/dL (ref 30.0–36.0)
MCV: 77.8 fL — ABNORMAL LOW (ref 80.0–100.0)
Platelets: 327 10*3/uL (ref 150–400)
RBC: 4.46 MIL/uL (ref 3.87–5.11)
RDW: 13.9 % (ref 11.5–15.5)
WBC: 15.1 10*3/uL — ABNORMAL HIGH (ref 4.0–10.5)
nRBC: 0 % (ref 0.0–0.2)

## 2018-05-29 LAB — TYPE AND SCREEN
ABO/RH(D): O POS
Antibody Screen: NEGATIVE

## 2018-05-29 LAB — RPR: RPR Ser Ql: NONREACTIVE

## 2018-05-29 LAB — ABO/RH: ABO/RH(D): O POS

## 2018-05-29 MED ORDER — EPHEDRINE 5 MG/ML INJ: 10.0000 mg | INTRAVENOUS | Status: DC | PRN

## 2018-05-29 MED ORDER — OXYTOCIN 40 UNITS IN NORMAL SALINE INFUSION - SIMPLE MED
2.5000 [IU]/h | INTRAVENOUS | Status: DC
  Filled 2018-05-29: qty 1000

## 2018-05-29 MED ORDER — SOD CITRATE-CITRIC ACID 500-334 MG/5ML PO SOLN: 30.0000 mL | ORAL | Status: DC | PRN

## 2018-05-29 MED ORDER — ACETAMINOPHEN 325 MG PO TABS
650.0000 mg | ORAL_TABLET | ORAL | Status: DC | PRN
  Administered 2018-05-30 (×2): 650 mg via ORAL
  Filled 2018-05-29 (×2): qty 2

## 2018-05-29 MED ORDER — OXYTOCIN 40 UNITS IN NORMAL SALINE INFUSION - SIMPLE MED
1.0000 m[IU]/min | INTRAVENOUS | Status: DC
  Administered 2018-05-29: 14:00:00 2 m[IU]/min via INTRAVENOUS

## 2018-05-29 MED ORDER — TETANUS-DIPHTH-ACELL PERTUSSIS 5-2.5-18.5 LF-MCG/0.5 IM SUSP: 0.5000 mL | Freq: Once | INTRAMUSCULAR | Status: DC

## 2018-05-29 MED ORDER — LACTATED RINGERS IV SOLN
INTRAVENOUS | Status: DC
  Administered 2018-05-29 (×3): via INTRAVENOUS

## 2018-05-29 MED ORDER — PHENYLEPHRINE 40 MCG/ML (10ML) SYRINGE FOR IV PUSH (FOR BLOOD PRESSURE SUPPORT)
80.0000 ug | PREFILLED_SYRINGE | INTRAVENOUS | Status: DC | PRN
  Filled 2018-05-29 (×2): qty 10

## 2018-05-29 MED ORDER — ONDANSETRON HCL 4 MG/2ML IJ SOLN
4.0000 mg | Freq: Four times a day (QID) | INTRAMUSCULAR | Status: DC | PRN
  Administered 2018-05-29: 10:00:00 4 mg via INTRAVENOUS
  Filled 2018-05-29: qty 2

## 2018-05-29 MED ORDER — PRENATAL MULTIVITAMIN CH
1.0000 | ORAL_TABLET | Freq: Every day | ORAL | Status: DC
  Administered 2018-05-29 – 2018-05-30 (×2): 1 via ORAL
  Filled 2018-05-29 (×2): qty 1

## 2018-05-29 MED ORDER — ZOLPIDEM TARTRATE 5 MG PO TABS: 5.0000 mg | ORAL_TABLET | Freq: Every evening | ORAL | Status: DC | PRN

## 2018-05-29 MED ORDER — ONDANSETRON HCL 4 MG/2ML IJ SOLN: 4.0000 mg | INTRAMUSCULAR | Status: DC | PRN

## 2018-05-29 MED ORDER — SIMETHICONE 80 MG PO CHEW: 80.0000 mg | CHEWABLE_TABLET | ORAL | Status: DC | PRN

## 2018-05-29 MED ORDER — DOCUSATE SODIUM 100 MG PO CAPS
100.0000 mg | ORAL_CAPSULE | Freq: Two times a day (BID) | ORAL | Status: DC
  Administered 2018-05-30: 10:00:00 100 mg via ORAL
  Filled 2018-05-29: qty 1

## 2018-05-29 MED ORDER — FENTANYL-BUPIVACAINE-NACL 0.5-0.125-0.9 MG/250ML-% EP SOLN
12.0000 mL/h | EPIDURAL | Status: DC | PRN
  Filled 2018-05-29: qty 250

## 2018-05-29 MED ORDER — BENZOCAINE-MENTHOL 20-0.5 % EX AERO: 1.0000 "application " | INHALATION_SPRAY | CUTANEOUS | Status: DC | PRN

## 2018-05-29 MED ORDER — LIDOCAINE HCL (PF) 1 % IJ SOLN: 30.0000 mL | INTRAMUSCULAR | Status: DC | PRN

## 2018-05-29 MED ORDER — PHENYLEPHRINE 40 MCG/ML (10ML) SYRINGE FOR IV PUSH (FOR BLOOD PRESSURE SUPPORT)
80.0000 ug | PREFILLED_SYRINGE | INTRAVENOUS | Status: DC | PRN
  Administered 2018-05-29 (×2): 80 ug via INTRAVENOUS

## 2018-05-29 MED ORDER — FLEET ENEMA 7-19 GM/118ML RE ENEM: 1.0000 | ENEMA | Freq: Every day | RECTAL | Status: DC | PRN

## 2018-05-29 MED ORDER — DIBUCAINE (PERIANAL) 1 % EX OINT: 1.0000 "application " | TOPICAL_OINTMENT | CUTANEOUS | Status: DC | PRN

## 2018-05-29 MED ORDER — TERBUTALINE SULFATE 1 MG/ML IJ SOLN: 0.2500 mg | Freq: Once | INTRAMUSCULAR | Status: DC | PRN

## 2018-05-29 MED ORDER — SODIUM CHLORIDE (PF) 0.9 % IJ SOLN
INTRAMUSCULAR | Status: DC | PRN
  Administered 2018-05-29: 14 mL/h via EPIDURAL

## 2018-05-29 MED ORDER — DIPHENHYDRAMINE HCL 25 MG PO CAPS: 25.0000 mg | ORAL_CAPSULE | Freq: Four times a day (QID) | ORAL | Status: DC | PRN

## 2018-05-29 MED ORDER — MEASLES, MUMPS & RUBELLA VAC IJ SOLR: 0.5000 mL | Freq: Once | INTRAMUSCULAR | Status: DC

## 2018-05-29 MED ORDER — IBUPROFEN 600 MG PO TABS
600.0000 mg | ORAL_TABLET | Freq: Four times a day (QID) | ORAL | Status: DC
  Administered 2018-05-29 – 2018-05-30 (×4): 600 mg via ORAL
  Filled 2018-05-29 (×4): qty 1

## 2018-05-29 MED ORDER — COCONUT OIL OIL: 1.0000 "application " | TOPICAL_OIL | Status: DC | PRN

## 2018-05-29 MED ORDER — ONDANSETRON HCL 4 MG PO TABS: 4.0000 mg | ORAL_TABLET | ORAL | Status: DC | PRN

## 2018-05-29 MED ORDER — METHYLERGONOVINE MALEATE 0.2 MG/ML IJ SOLN: 0.2000 mg | INTRAMUSCULAR | Status: DC | PRN

## 2018-05-29 MED ORDER — WITCH HAZEL-GLYCERIN EX PADS: 1.0000 "application " | MEDICATED_PAD | CUTANEOUS | Status: DC | PRN

## 2018-05-29 MED ORDER — METHYLERGONOVINE MALEATE 0.2 MG PO TABS: 0.2000 mg | ORAL_TABLET | ORAL | Status: DC | PRN

## 2018-05-29 MED ORDER — OXYTOCIN BOLUS FROM INFUSION
500.0000 mL | Freq: Once | INTRAVENOUS | Status: AC
  Administered 2018-05-29: 16:00:00 500 mL via INTRAVENOUS

## 2018-05-29 MED ORDER — LIDOCAINE-EPINEPHRINE (PF) 2 %-1:200000 IJ SOLN
INTRAMUSCULAR | Status: DC | PRN
  Administered 2018-05-29: 2 mL via EPIDURAL
  Administered 2018-05-29: 3 mL via EPIDURAL

## 2018-05-29 MED ORDER — BISACODYL 10 MG RE SUPP: 10.0000 mg | Freq: Every day | RECTAL | Status: DC | PRN

## 2018-05-29 MED ORDER — ACETAMINOPHEN 325 MG PO TABS: 650.0000 mg | ORAL_TABLET | ORAL | Status: DC | PRN

## 2018-05-29 MED ORDER — FENTANYL CITRATE (PF) 100 MCG/2ML IJ SOLN
100.0000 ug | INTRAMUSCULAR | Status: DC | PRN
  Administered 2018-05-29: 100 ug via INTRAVENOUS
  Filled 2018-05-29: qty 2

## 2018-05-29 MED ORDER — DIPHENHYDRAMINE HCL 50 MG/ML IJ SOLN: 12.5000 mg | INTRAMUSCULAR | Status: DC | PRN

## 2018-05-29 MED ORDER — LACTATED RINGERS IV SOLN: 500.0000 mL | INTRAVENOUS | Status: DC | PRN

## 2018-05-29 MED ORDER — FERROUS SULFATE 325 (65 FE) MG PO TABS
325.0000 mg | ORAL_TABLET | Freq: Two times a day (BID) | ORAL | Status: DC
  Administered 2018-05-30: 325 mg via ORAL
  Filled 2018-05-29: qty 1

## 2018-05-29 MED ORDER — LACTATED RINGERS IV SOLN: 500.0000 mL | Freq: Once | INTRAVENOUS | Status: DC

## 2018-05-29 NOTE — H&P (Signed)
LABOR AND DELIVERY ADMISSION HISTORY AND PHYSICAL NOTE  Alexandra Short is a 28 y.o. female G71P2002 with IUP at [redacted]w[redacted]d by LMP=1st trimester [place] presenting for labor.  She reports positive fetal movement. She denies leakage of fluid or vaginal bleeding.  Prenatal History/Complications: PNC at [facility] Pregnancy complications:  - anxiety and depression - tobacco use - marijuana use  Past Medical History: Past Medical History:  Diagnosis Date  . Postpartum depression 06/11/2010    Past Surgical History: History reviewed. No pertinent surgical history.  Obstetrical History: OB History    Gravida  3   Para  2   Term  2   Preterm  0   AB  0   Living  2     SAB  0   TAB  0   Ectopic  0   Multiple  0   Live Births  2           Social History: Social History   Socioeconomic History  . Marital status: Significant Other    Spouse name: Not on file  . Number of children: 2  . Years of education: Not on file  . Highest education level: Not on file  Occupational History  . Not on file  Social Needs  . Financial resource strain: Not on file  . Food insecurity:    Worry: Not on file    Inability: Not on file  . Transportation needs:    Medical: Not on file    Non-medical: Not on file  Tobacco Use  . Smoking status: Former Games developer  . Smokeless tobacco: Never Used  . Tobacco comment: 2-3 cigs daily  Substance and Sexual Activity  . Alcohol use: Not Currently    Alcohol/week: 20.0 standard drinks    Types: 20 Standard drinks or equivalent per week  . Drug use: Not Currently    Types: Marijuana    Comment: LAST SMOKED  2 MTHS AGO  . Sexual activity: Yes    Birth control/protection: None  Lifestyle  . Physical activity:    Days per week: Not on file    Minutes per session: Not on file  . Stress: Not on file  Relationships  . Social connections:    Talks on phone: Not on file    Gets together: Not on file    Attends religious service: Not on file    Active  member of club or organization: Not on file    Attends meetings of clubs or organizations: Not on file    Relationship status: Not on file  Other Topics Concern  . Not on file  Social History Narrative  . Not on file    Family History: Family History  Problem Relation Age of Onset  . Hypertension Mother   . Depression Mother   . Anxiety disorder Mother   . Diabetes Father   . Diabetes Maternal Aunt   . Diabetes Maternal Uncle     Allergies: No Known Allergies  Medications Prior to Admission  Medication Sig Dispense Refill Last Dose  . Prenatal Vit-Fe Fumarate-FA (PRENATAL LOW IRON) 27-0.8 MG TABS Take 1 tablet by mouth daily. (Patient not taking: Reported on 05/27/2018) 30 each 0 Not Taking     Review of Systems  All systems reviewed and negative except as stated in HPI  Physical Exam Blood pressure 120/77, pulse 92, temperature 98.4 F (36.9 C), temperature source Oral, resp. rate 17, height 5\' 9"  (1.753 m), weight 73 kg, last menstrual period 08/17/2017. General  appearance: alert, oriented, NAD Lungs: normal respiratory effort Heart: regular rate Abdomen: soft, non-tender; gravid, FH appropriate for GA Extremities: No calf swelling or tenderness Presentation: cephalic Fetal monitoring: baseline 140s/mod var/+ acels/variable decels Uterine activity: q3-8233m Dilation: 5 Effacement (%): 70 Station: -2 Exam by:: BENJI, RN  Prenatal labs: ABO, Rh: --/--/O POS (04/18 [id number]) Antibody: NEG (04/18 [id number]) Rubella: 3.54 (08/26 1529) RPR: Non Reactive (01/24 1457)  HBsAg: Negative (08/26 1529)  HIV: Non Reactive (01/24 1457)  GC/Chlamydia: negative GBS: Negative (03/18 0000)  1-hr GTT: 85 Genetic screening:  Unable to obtain Anatomy US: normal  Prenatal Transfer Tool  Maternal Diabetes: No Genetic Screening: Declined Maternal Ultrasounds/Referrals: Normal Fetal Ultrasounds or other Referrals:  None Maternal Substance Abuse:  Yes:  Type: Smoker,  Marijuana Significant Maternal Medications:  None Significant Maternal Lab Results: None  Results for orders placed or performed during the hospital encounter of 05/29/18 (from the past 24 hour(s))  Type and screen [facility]   Collection Time: 05/29/18  4:38 AM  Result Value Ref Range   ABO/RH(D) O POS    Antibody Screen NEG    Sample Expiration      06/01/2018 Performed at [facility], [address] [place], [place] [address]   CBC   Collection Time: 05/29/18  4:45 AM  Result Value Ref Range   WBC 15.1 (H) 4.0 - 10.5 K/uL   RBC 4.46 3.87 - 5.11 MIL/uL   Hemoglobin 11.3 (L) 12.0 - 15.0 g/dL   HCT 44.034.7 (L) 10.236.0 - 72.546.0 %   MCV 77.8 (L) 80.0 - 100.0 fL   MCH 25.3 (L) 26.0 - 34.0 pg   MCHC 32.6 30.0 - 36.0 g/dL   RDW 36.613.9 44.011.5 - 34.715.5 %   Platelets 327 150 - 400 K/uL   nRBC 0.0 0.0 - 0.2 %    Patient Active Problem List   Diagnosis Date Noted  . Normal labor 05/29/2018  . Abnormal Pap smear of cervix 01/28/2018  . Supervision of normal pregnancy 10/07/2017  . Screening for STDs (sexually transmitted diseases) 07/07/2017  . Healthcare maintenance 07/07/2017  . 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 Generalized anxiety disorder 06/29/2017  . Tobacco use disorder 03/07/2010    Assessment: Thamas Jaegerslexandra Postel is a 28 y.o. G3P2002 at 688w5d here for SOL  #Labor: progressing well. Expectant management.  #Pain: Desires epidural #FWB: Cat 2 (reassuring) #ID:  GBS (-) #MOF: formula #MOC:POPs #Circ:  NA  Cystal Shannahan,MD 05/29/2018, 5:43 AM

## 2018-05-29 NOTE — Progress Notes (Signed)
Vitals:   05/29/18 1102 05/29/18 1130  BP: (!) 91/56 (!) 95/58  Pulse: 93 68  Resp: 16 16  Temp: 97.7 F (36.5 C)   SpO2:     cx 6/100/-2. AROM w/mod mec fluid. FHR Cat 1.  Comfortable w/epidural. Ctx not picking up well right now. Will place IUPC if needed.

## 2018-05-29 NOTE — Progress Notes (Signed)
cx unchanged from 2 hours ago. Will augment w/pitocin.  FHR Cat 1

## 2018-05-29 NOTE — MAU Note (Signed)
PT SAYS SHE WAS HERE ON 13TH- 1 CM.   IS AN INDUCTION  ON 20TH.    UC HIRT BAD SINCE 11PM.  DENIES HSV  AND MRSA. GBS-  UNSURE.

## 2018-05-29 NOTE — Anesthesia Procedure Notes (Signed)
Epidural Patient location during procedure: OB Start time: 05/29/2018 7:05 AM End time: 05/29/2018 7:20 AM  Staffing Anesthesiologist: Elmer Picker, MD Performed: anesthesiologist   Preanesthetic Checklist Completed: patient identified, pre-op evaluation, timeout performed, IV checked, risks and benefits discussed and monitors and equipment checked  Epidural Patient position: sitting Prep: site prepped and draped and DuraPrep Patient monitoring: continuous pulse ox, blood pressure, heart rate and cardiac monitor Approach: midline Location: L3-L4 Injection technique: LOR air  Needle:  Needle type: Tuohy  Needle gauge: 17 G Needle length: 9 cm Needle insertion depth: 5 cm Catheter type: closed end flexible Catheter size: 19 Gauge Catheter at skin depth: 10 cm Test dose: negative  Assessment Sensory level: T8 Events: blood not aspirated, injection not painful, no injection resistance, negative IV test and no paresthesia  Additional Notes Patient identified. Risks/Benefits/Options discussed with patient including but not limited to bleeding, infection, nerve damage, paralysis, failed block, incomplete pain control, headache, blood pressure changes, nausea, vomiting, reactions to medication both or allergic, itching and postpartum back pain. Confirmed with bedside nurse the patient's most recent platelet count. Confirmed with patient that they are not currently taking any anticoagulation, have any bleeding history or any family history of bleeding disorders. Patient expressed understanding and wished to proceed. All questions were answered. Sterile technique was used throughout the entire procedure. Please see nursing notes for vital signs. Test dose was given through epidural catheter and negative prior to continuing to dose epidural or start infusion. Warning signs of high block given to the patient including shortness of breath, tingling/numbness in hands, complete motor block,  or any concerning symptoms with instructions to call for help. Patient was given instructions on fall risk and not to get out of bed. All questions and concerns addressed with instructions to call with any issues or inadequate analgesia.  Reason for block:procedure for pain

## 2018-05-29 NOTE — Anesthesia Preprocedure Evaluation (Signed)
Anesthesia Evaluation  Patient identified by MRN, date of birth, ID band Patient awake    Reviewed: Allergy & Precautions, NPO status , Patient's Chart, lab work & pertinent test results  Airway Mallampati: II  TM Distance: >3 FB Neck ROM: Full    Dental no notable dental hx.    Pulmonary neg pulmonary ROS, former smoker,    Pulmonary exam normal breath sounds clear to auscultation       Cardiovascular negative cardio ROS Normal cardiovascular exam Rhythm:Regular Rate:Normal     Neuro/Psych negative neurological ROS  negative psych ROS   GI/Hepatic negative GI ROS, Neg liver ROS,   Endo/Other  negative endocrine ROS  Renal/GU negative Renal ROS  negative genitourinary   Musculoskeletal negative musculoskeletal ROS (+)   Abdominal   Peds  Hematology negative hematology ROS (+)   Anesthesia Other Findings   Reproductive/Obstetrics (+) Pregnancy                             Anesthesia Physical Anesthesia Plan  ASA: II  Anesthesia Plan: Epidural   Post-op Pain Management:    Induction:   PONV Risk Score and Plan: Treatment may vary due to age or medical condition  Airway Management Planned: Natural Airway  Additional Equipment:   Intra-op Plan:   Post-operative Plan:   Informed Consent: I have reviewed the patients History and Physical, chart, labs and discussed the procedure including the risks, benefits and alternatives for the proposed anesthesia with the patient or authorized representative who has indicated his/her understanding and acceptance.     Plan Discussed with: Anesthesiologist  Anesthesia Plan Comments: (Patient identified. Risks, benefits, options discussed with patient including but not limited to bleeding, infection, nerve damage, paralysis, failed block, incomplete pain control, headache, blood pressure changes, nausea, vomiting, reactions to medication,  itching, and post partum back pain. Confirmed with bedside nurse the patient's most recent platelet count. Confirmed with the patient that they are not taking any anticoagulation, have any bleeding history or any family history of bleeding disorders. Patient expressed understanding and wishes to proceed. All questions were answered. )        Anesthesia Quick Evaluation  

## 2018-05-29 NOTE — Discharge Summary (Signed)
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 yo [id number] at 6146w5d was admitted in Active Labor on 05/29/2018. Patient had an uncomplicated labor course as follows: pt's labor stalled out around 6 cms, so augmented w/Pitocin. Pushed about 10 minutes. Had mod mec fluid, no problems. Membrane Rupture Time/Date: 11:45 AM ,05/29/2018   Intrapartum Procedures: Episiotomy: None [1]                                         Lacerations:  None [1]  Patient had a delivery of a Viable infant. 05/29/2018  Information for the patient's newborn:  Colvin CaroliMims, Girl Dynastee [[id number]][[id number]]  Delivery Method: Vag-Spont   Patient had an uncomplicated postpartum course.  She is ambulating, tolerating a regular diet, passing flatus, and urinating well. Patient is discharged home in stable condition on 05/30/18.  Magnesium Sulfate recieved: No BMZ received: No  Physical exam  Vitals:   05/29/18 1900 05/29/18 2250 05/30/18 0150 05/30/18 0532  BP: 100/74 (!) 93/58 (!) 95/58 101/66  Pulse: 78 (!) 57 (!) 58 63  Resp: 16 18 18 18   Temp: 97.6 F (36.4 C) 98.4 F (36.9 C) 98.2 F (36.8 C) 97.6 F (36.4 C)  TempSrc: Oral Oral Oral Oral  SpO2:      Weight:      Height:       General: alert, well-appearing, NAD Lochia: appropriate Uterine Fundus: firm Incision: N/A DVT Evaluation: No significant calf/ankle  edema  Labs: Lab Results  Component Value Date   WBC 15.1 (H) 05/29/2018   HGB 11.3 (L) 05/29/2018   HCT 34.7 (L) 05/29/2018   MCV 77.8 (L) 05/29/2018   PLT 327 05/29/2018   CMP Latest Ref Rng & Units 07/07/2017  Glucose 65 - 99 mg/dL 98  BUN 6 - 20 mg/dL 12  Creatinine 1.300.57 - 8.651.00 mg/dL 7.840.82  Sodium 696134 - 295144 mmol/L 139  Potassium 3.5 - 5.2 mmol/L 4.3  Chloride 96 - 106 mmol/L 103  CO2 20 - 29 mmol/L 21  Calcium 8.7 - 10.2 mg/dL 9.4  Total Protein 6.0 - 8.3 g/dL -  Total Bilirubin 0.3 - 1.2 mg/dL -  Alkaline Phos 39 - 284117 units/L -  AST 0 - 37 units/L -  ALT 0 - 35 units/L -    Discharge instruction: per After Visit Summary and "Baby and Me Booklet".  After visit meds:  Allergies as of 05/30/2018   No Known Allergies     Medication List    TAKE these medications   docusate sodium 100 MG capsule Commonly known as:  COLACE Take 1 capsule (100 mg total) by mouth 2 (two) times daily as needed for mild constipation.   ibuprofen  800 MG tablet Commonly known as:  ADVIL Take 1 tablet (800 mg total) by mouth 3 (three) times daily.   Prenatal Low Iron 27-0.8 MG Tabs Take 1 tablet by mouth daily.       Diet: routine diet Activity: Advance as tolerated. Pelvic rest for 6 weeks.   Outpatient follow up:4-6 weeks, message sent to schedule Follow up Appt: Future Appointments  Date Time Provider Department Center  06/01/2018  8:50 AM [facility]   Follow up Visit: Follow-up Information    [facility]. Schedule an appointment as soon as possible for a visit.   Why:  You should receive a call to schedule a postpartum follow-up visit. If you do not, please call your clinic to schedule an appointment in 4-6 weeks.  Contact information: [address] [place] [address] [phone]        Please schedule this patient for Postpartum visit in: 6 weeks with the following provider: Any provider For C/S patients schedule  nurse incision check in weeks 2 weeks:  Low risk pregnancy complicated by:  Delivery mode:  SVD Anticipated Birth Control:  POPs (PROBLEM LIST SAYS POPS, BUT WHY NOT COCS?  PP Procedures needed:   Schedule Integrated BH visit: no Has Hx of PPD  Newborn Data: Live born female  Birth Weight:  3310g APGAR: 9, 9  Newborn Delivery   Birth date/time:  05/29/2018 15:51:00 Delivery type:  Vaginal, Spontaneous    Baby Feeding: Bottle Disposition:home with mother  05/30/2018 Tamera Stands, DO

## 2018-05-30 MED ORDER — DOCUSATE SODIUM 100 MG PO CAPS: 100.0000 mg | ORAL_CAPSULE | Freq: Two times a day (BID) | ORAL | 0 refills | Status: DC | PRN

## 2018-05-30 MED ORDER — IBUPROFEN 800 MG PO TABS: 800.0000 mg | ORAL_TABLET | Freq: Three times a day (TID) | ORAL | 0 refills | Status: DC

## 2018-05-30 MED ORDER — METHYLERGONOVINE MALEATE 0.2 MG/ML IJ SOLN: 0.2000 mg | INTRAMUSCULAR | Status: DC

## 2018-05-30 NOTE — Anesthesia Postprocedure Evaluation (Signed)
Anesthesia Post Note  Patient: Alexandra Short  Procedure(s) Performed: AN AD HOC LABOR EPIDURAL     Patient location during evaluation: Mother Baby Anesthesia Type: Epidural Level of consciousness: awake Pain management: satisfactory to patient Vital Signs Assessment: post-procedure vital signs reviewed and stable Respiratory status: spontaneous breathing Cardiovascular status: stable Anesthetic complications: no    Last Vitals:  Vitals:   05/30/18 0150 05/30/18 0532  BP: (!) 95/58 101/66  Pulse: (!) 58 63  Resp: 18 18  Temp: 36.8 C 36.4 C  SpO2:      Last Pain:  Vitals:   05/30/18 0618  TempSrc:   PainSc: 3    Pain Goal: Patients Stated Pain Goal: 2 (05/30/18 0618)                 Cephus Shelling

## 2018-05-30 NOTE — Clinical Social Work Maternal (Signed)
CLINICAL SOCIAL WORK MATERNAL/CHILD NOTE  Patient Details  Name: Alexandra Short MRN: [id number] Date of Birth: 08-01-1990  Date:  05/30/2018  Clinical Social Worker Initiating Note:  Laurey Arrow Date/Time: Initiated:  05/30/18/1223     Child's Name:  Alexandra Short   Biological Parents:  Mother, Father   Need for Interpreter:  None   Reason for Referral:  Behavioral Health Concerns, Current Substance Use/Substance Use During Pregnancy    Address:  [place] [address]    Phone number:  [phone] (home)     Additional phone number:   Household Members/Support Persons (HM/SP):   Household Member/Support Person 1, Household Member/Support Person 2, Household Member/Support Person 3   HM/SP Name Relationship DOB or Age  HM/SP -1 Justice Walker FOB 10/28/1989  HM/SP -2 Alanii Rufino daughter 01/14/09  HM/SP -3 Aalyha Hightower daughter 05/17/10  HM/SP -4        HM/SP -5        HM/SP -6        HM/SP -7        HM/SP -8          Natural Supports (not living in the home):  Extended Family, Parent   Professional Supports: Other (Comment)(MOB is currently on probation but declined to provide probation officer name. )   Employment: Unemployed   Type of Work:     Education:  9 to 11 years(MOB reported she completed 9th grade. )   Homebound arranged: No  Financial Resources:  [employer]   Other Resources:  [employer], Physicist, medical    Cultural/Religious Considerations Which May Impact Care:  None reported  Strengths:  Ability to meet basic needs , Home prepared for child , Pediatrician chosen   Psychotropic Medications:         Pediatrician:    [place]  Pediatrician List:   [place]  [place]      Pediatrician Fax Number:    Risk Factors/Current Problems:  Mental Health Concerns , Substance Use , Legal Issues     Cognitive State:  Linear Thinking , Insightful    Mood/Affect:  Calm , Interested , Relaxed , Comfortable    CSW Assessment: CSW met with MOB in room 516 to complete an assessment for MH hx and SA hx.  When CSW arrived, MOB and infant was asleep in MOB's bed. MOB was easily awaken and CSW immediately provided MOB with SIDS education and placed infant in bassinet. CSW explained CSW's role and MOB was receptive to meeting with CSW.  MOB was polite and forthcoming,   CSW asked about MOB's MH hx and MOB acknowledged a dx of anxiety/depression.  MOB reported being dx in 2019 and receiving medication treatment to manage symptoms. MOB stated, "I stopped taking my meds when I went to jail because they would not give them to me." CSW offered to assist MOB with restarting medications and MOB declined stating, "I'm good, I have not had any problems since I've been home from jail." CSW provided education regarding the baby blues period vs. perinatal mood disorders, discussed treatment and gave resources for mental health follow up if concerns arise. MOB did not present with any MH symptoms and reported feeling comfortable seeking help if help is needed. CSW assessed for safety and MOB denied SI/HI and DV.  Per MOB, MOB's main source of support  will be FOB and MOB's mother.   CSW asked about MOB's SA hx and MOB openly shared her use of marijuana throughout her pregnancy.  MOB reported smoking marijuana prenatally to assist with decreasing MOB's nausea. CSW made MOB aware of hospital's substance exposed policy, and MOB was understanding.  CSW shared that CSW is monitoring infant's UDS and CDS and will make a report to [facility] is warranted.  MOB acknowledged CPS hx and reported she does not currently have an active CPS case. CSW offered outpatient SA/MH resources to MOB and MOB declined.   MOB reported having all essential items for infant and communicated she feels prepared to parent.   CSW  identifies no further need for intervention and no barriers to discharge at this time.  CSW Plan/Description:  No Further Intervention Required/No Barriers to Discharge, Sudden Infant Death Syndrome (SIDS) Education, Other Patient/Family Education, [place], Other Information/Referral to [employer], CSW Will Continue to Monitor Umbilical Cord Tissue Drug Screen Results and Make Report if Warranted   Laurey Arrow, MSW, LCSW Clinical Social Work [phone]   Dimple Nanas, LCSW 05/30/2018, 12:30 PM

## 2018-06-09 ENCOUNTER — Ambulatory Visit: Payer: Medicaid Other | Admitting: Family Medicine

## 2018-06-09 ENCOUNTER — Other Ambulatory Visit: Payer: Self-pay

## 2018-06-09 DIAGNOSIS — Z30011 Encounter for initial prescription of contraceptive pills: Secondary | ICD-10-CM | POA: Diagnosis not present

## 2018-06-09 MED ORDER — NORGESTIMATE-ETH ESTRADIOL 0.25-35 MG-MCG PO TABS: 1.0000 | ORAL_TABLET | Freq: Every day | ORAL | 11 refills | Status: DC

## 2018-06-09 NOTE — Patient Instructions (Signed)
Ethinyl Estradiol; Norgestimate tablets  What is this medicine?  ETHINYL ESTRADIOL; NORGESTIMATE (ETH in il es tra DYE ole; nor JES ti mate) is an oral contraceptive. The products combine two types of female hormones, an estrogen and a progestin. They are used to prevent ovulation and pregnancy. Some products are also used to treat acne in females.  This medicine may be used for other purposes; ask your health care provider or pharmacist if you have questions.  COMMON BRAND NAME(S): Estarylla, Mili, MONO-LINYAH, MonoNessa, Norgestimate/Ethinyl Estradiol, Ortho Tri-Cyclen, Ortho Tri-Cyclen Lo, Ortho-Cyclen, Previfem, Sprintec, Tri-Estarylla, TRI-LINYAH, Tri-Lo-Estarylla, Tri-Lo-Marzia, Tri-Lo-Mili, Tri-Lo-Sprintec, Tri-Mili, Tri-Previfem, Tri-Sprintec, Tri-VyLibra, Trinessa, Trinessa Lo, VyLibra  What should I tell my health care provider before I take this medicine?  They need to know if you have or ever had any of these conditions:  -abnormal vaginal bleeding  -blood vessel disease or blood clots  -breast, cervical, endometrial, ovarian, liver, or uterine cancer  -diabetes  -gallbladder disease  -heart disease or recent heart attack  -high blood pressure  -high cholesterol  -kidney disease  -liver disease  -migraine headaches  -stroke  -systemic lupus erythematosus (SLE)  -tobacco smoker  -an unusual or allergic reaction to estrogens, progestins, other medicines, foods, dyes, or preservatives  -pregnant or trying to get pregnant  -breast-feeding  How should I use this medicine?  Take this medicine by mouth. To reduce nausea, this medicine may be taken with food. Follow the directions on the prescription label. Take this medicine at the same time each day and in the order directed on the package. Do not take your medicine more often than directed.  Contact your pediatrician regarding the use of this medicine in children. Special care may be needed. This medicine has been used in female children who have started  having menstrual periods.  A patient package insert for the product will be given with each prescription and refill. Read this sheet carefully each time. The sheet may change frequently.  Overdosage: If you think you have taken too much of this medicine contact a poison control center or emergency room at once.  NOTE: This medicine is only for you. Do not share this medicine with others.  What if I miss a dose?  If you miss a dose, refer to the patient information sheet you received with your medicine for direction. If you miss more than one pill, this medicine may not be as effective and you may need to use another form of birth control.  What may interact with this medicine?  Do not take this medicine with the following medication:  -dasabuvir; ombitasvir; paritaprevir; ritonavir  -ombitasvir; paritaprevir; ritonavir  This medicine may also interact with the following medications:  -acetaminophen  -antibiotics or medicines for infections, especially rifampin, rifabutin, rifapentine, and griseofulvin, and possibly penicillins or tetracyclines  -aprepitant  -ascorbic acid (vitamin C)  -atorvastatin  -barbiturate medicines, such as phenobarbital  -bosentan  -carbamazepine  -caffeine  -clofibrate  -cyclosporine  -dantrolene  -doxercalciferol  -felbamate  -grapefruit juice  -hydrocortisone  -medicines for anxiety or sleeping problems, such as diazepam or temazepam  -medicines for diabetes, including pioglitazone  -mineral oil  -modafinil  -mycophenolate  -nefazodone  -oxcarbazepine  -phenytoin  -prednisolone  -ritonavir or other medicines for HIV infection or AIDS  -rosuvastatin  -selegiline  -soy isoflavones supplements  -St. John's wort  -tamoxifen or raloxifene  -theophylline  -thyroid hormones  -topiramate  -warfarin  This list may not describe all possible interactions. Give your health care provider a   list of all the medicines, herbs, non-prescription drugs, or dietary supplements you use. Also tell them if you  smoke, drink alcohol, or use illegal drugs. Some items may interact with your medicine.  What should I watch for while using this medicine?  Visit your doctor or health care professional for regular checks on your progress. You will need a regular breast and pelvic exam and Pap smear while on this medicine. You should also discuss the need for regular mammograms with your health care professional, and follow his or her guidelines for these tests.  This medicine can make your body retain fluid, making your fingers, hands, or ankles swell. Your blood pressure can go up. Contact your doctor or health care professional if you feel you are retaining fluid.  Use an additional method of contraception during the first cycle that you take these tablets.  If you have any reason to think you are pregnant, stop taking this medicine right away and contact your doctor or health care professional.  If you are taking this medicine for hormone related problems, it may take several cycles of use to see improvement in your condition.  Do not use this product if you smoke and are over 35 years of age. Smoking increases the risk of getting a blood clot or having a stroke while you are taking birth control pills, especially if you are more than 29 years old. If you are a smoker who is 35 years of age or younger, you are strongly advised not to smoke while taking birth control pills.  This medicine can make you more sensitive to the sun. Keep out of the sun. If you cannot avoid being in the sun, wear protective clothing and use sunscreen. Do not use sun lamps or tanning beds/booths.  If you wear contact lenses and notice visual changes, or if the lenses begin to feel uncomfortable, consult your eye care specialist.  In some women, tenderness, swelling, or minor bleeding of the gums may occur. Notify your dentist if this happens. Brushing and flossing your teeth regularly may help limit this. See your dentist regularly and inform your  dentist of the medicines you are taking.  If you are going to have elective surgery, you may need to stop taking this medicine before the surgery. Consult your health care professional for advice.  This medicine does not protect you against HIV infection (AIDS) or any other sexually transmitted diseases.  What side effects may I notice from receiving this medicine?  Side effects that you should report to your doctor or health care professional as soon as possible:  -breast tissue changes or discharge  -changes in vaginal bleeding during your period or between your periods  -chest pain  -coughing up blood  -dizziness or fainting spells  -headaches or migraines  -leg, arm or groin pain  -severe or sudden headaches  -stomach pain (severe)  -sudden shortness of breath  -sudden loss of coordination, especially on one side of the body  -speech problems  -symptoms of vaginal infection like itching, irritation or unusual discharge  -tenderness in the upper abdomen  -vomiting  -weakness or numbness in the arms or legs, especially on one side of the body  -yellowing of the eyes or skin  Side effects that usually do not require medical attention (report to your doctor or health care professional if they continue or are bothersome):  -breakthrough bleeding and spotting that continues beyond the 3 initial cycles of pills  -breast tenderness  -mood   changes, anxiety, depression, frustration, anger, or emotional outbursts  -increased sensitivity to sun or ultraviolet light  -nausea  -skin rash, acne, or brown spots on the skin  -weight gain (slight)  This list may not describe all possible side effects. Call your doctor for medical advice about side effects. You may report side effects to FDA at 1-800-FDA-1088.  Where should I keep my medicine?  Keep out of the reach of children.  Store at room temperature between 15 and 30 degrees C (59 and 86 degrees F). Throw away any unused medicine after the expiration date.  NOTE: This sheet  is a summary. It may not cover all possible information. If you have questions about this medicine, talk to your doctor, pharmacist, or health care provider.   2019 Elsevier/Gold Standard (2015-10-08 08:09:09)

## 2018-06-09 NOTE — Progress Notes (Signed)
Subjective:     Alexandra Short is a 28 y.o. female who presents for a postpartum visit. She is 11 days postpartum following a spontaneous vaginal delivery. I have fully reviewed the prenatal and intrapartum course. The delivery was at [redacted]w[redacted]d gestational weeks. Outcome: spontaneous vaginal delivery. Anesthesia: epidural. Postpartum course has been uncomplicated. Baby's course has been uncomplicated. Baby is feeding by bottle Rush Barer. Bleeding no bleeding. Bowel function is normal. Bladder function is normal. Patient is not sexually active. Contraception method is OCP (estrogen/progesterone). Postpartum depression screening: negative.   Review of Systems: Per HPI  Objective:    BP 104/60   Pulse (!) 104   Wt 147 lb (66.7 kg)   LMP 08/17/2017   SpO2 97%   Breastfeeding No   BMI 21.71 kg/m   General:  alert, cooperative, appears stated age and no distress  Lungs: clear to auscultation bilaterally  Heart:  regular rate and rhythm, S1, S2 normal, no murmur, click, rub or gallop  Abdomen: soft, non-tender; bowel sounds normal; no masses,  no organomegaly  Corpus: normal, not palpable         Assessment:     Normal postpartum exam.   Plan:    1. Contraception: OCP (estrogen/progesterone). Counseled on starting at around 6 weeks postpartum and to use backup method for the first 7 days. 2. Follow up  as needed.    Leland Her, DO PGY-3, [facility] 06/09/2018 9:36 AM

## 2018-07-14 ENCOUNTER — Other Ambulatory Visit: Payer: Self-pay | Admitting: Family Medicine

## 2018-07-14 ENCOUNTER — Telehealth: Payer: Self-pay | Admitting: *Deleted

## 2018-07-14 DIAGNOSIS — Z30011 Encounter for initial prescription of contraceptive pills: Secondary | ICD-10-CM

## 2018-07-14 MED ORDER — NORGESTIMATE-ETH ESTRADIOL 0.25-35 MG-MCG PO TABS: 1.0000 | ORAL_TABLET | Freq: Every day | ORAL | 11 refills | Status: DC

## 2018-07-14 NOTE — Telephone Encounter (Signed)
I sent in a new prescription for her OCPs today to [facility] on [address].  Thanks.

## 2018-07-14 NOTE — Telephone Encounter (Signed)
Pt states she never picked up her OCP and now the pharmacy tells her that she needs a new script.  Pt has not had sex since having the baby and thinks that her period is about to start because she is spotting.   Will forward to MD. Jone Baseman, CMA

## 2018-07-14 NOTE — Telephone Encounter (Signed)
Pt informed. Alexandra Short T Nga Rabon, CMA  

## 2018-08-12 ENCOUNTER — Ambulatory Visit: Payer: Medicaid Other | Admitting: Family Medicine

## 2018-08-20 ENCOUNTER — Ambulatory Visit: Payer: Medicaid Other | Admitting: Family Medicine

## 2019-01-12 ENCOUNTER — Ambulatory Visit (INDEPENDENT_AMBULATORY_CARE_PROVIDER_SITE_OTHER): Payer: Medicaid Other | Admitting: Family Medicine

## 2019-01-12 ENCOUNTER — Other Ambulatory Visit: Payer: Self-pay

## 2019-01-12 ENCOUNTER — Encounter: Payer: Self-pay | Admitting: Family Medicine

## 2019-01-12 VITALS — BP 102/60 | HR 85 | Wt 151.6 lb

## 2019-01-12 DIAGNOSIS — Z3482 Encounter for supervision of other normal pregnancy, second trimester: Secondary | ICD-10-CM | POA: Diagnosis not present

## 2019-01-12 DIAGNOSIS — Z3200 Encounter for pregnancy test, result unknown: Secondary | ICD-10-CM

## 2019-01-12 LAB — POCT URINE PREGNANCY: Preg Test, Ur: POSITIVE — AB

## 2019-01-12 MED ORDER — PRENATAL ADULT GUMMY/DHA/FA 0.4-25 MG PO CHEW: 1.0000 | CHEWABLE_TABLET | Freq: Every day | ORAL | 4 refills | Status: AC

## 2019-01-12 NOTE — Patient Instructions (Addendum)
   It was nice seeing you today Ms. Tipping!  Congratulations on your pregnancy!  I have sent prenatal vitamins to your pharmacy - please take these daily.  We are also scheduling your anatomy ultrasound for about four weeks from now.  We will get your initial labs today and let you know if there are any abnormalities.  I would like to see you back for your hour-long initial prenatal appointment in a few weeks.  If you have any questions or concerns, please feel free to call the clinic.   Be well,  Dr. Shan Levans

## 2019-01-12 NOTE — Progress Notes (Signed)
   Subjective:    Alexandra Short - 28 y.o. female MRN [id number]  Date of birth: 1990/06/27  CC:  Alexandra Short is here for a positive pregnancy test.  HPI: Patient reports that her LMP was on 8/24, and her periods were regular before then.  She had a positive pregnancy test in September but was too busy to make an appointment until now.  She feels good about this pregnancy and says that she has support.  She does continue to smoke 1 to 2 cigarettes/day and has not started to take prenatal vitamins.  She had her previous baby in April 2020.  She would like to bottlefeed and would like the Nexplanon for contraception after this pregnancy.  Health Maintenance:  There are no preventive care reminders to display for this patient.  -  reports that she has been smoking cigarettes. She has been smoking about 0.10 packs per day. She has never used smokeless tobacco. - Review of Systems: Per HPI. - Past Medical History: Patient Active Problem List   Diagnosis Date Noted  . Normal labor 05/29/2018  . Abnormal Pap smear of cervix 01/28/2018  . Supervision of normal pregnancy 10/07/2017  . Mild episode of recurrent depressive disorder ([place]) 06/29/2017  . Generalized anxiety disorder 06/29/2017  . Tobacco use disorder 03/07/2010   - Medications: reviewed and updated   Objective:   Physical Exam BP 102/60   Pulse 85   Wt 151 lb 9.6 oz (68.8 kg)   LMP 10/04/2018 (Exact Date)   SpO2 100%   BMI 22.39 kg/m  Gen: NAD, alert, cooperative with exam, well-appearing CV: RRR, good S1/S2, no murmur, no edema Resp: CTABL, no wheezes, non-labored Psych: Appropriate mood and affect FHT: 157 bpm Fundal height: 14 cm       Assessment & Plan:   Supervision of normal pregnancy Urine pregnancy test is positive in our office.  Using patient's LMP, she is currently at 14 weeks 2 days gestation.  We will obtain initial prenatal labs and schedule her for her anatomy ultrasound at around 18 weeks.  She  is interested in the quad screen, which she can get at her initial OB appointment as long as it is before week 20.  She was counseled on taking prenatal vitamins every day and these are sent to her pharmacy.  She was also counseled on smoking cessation.  She was advised to return in the next week or 2 for her initial OB appointment.    Maia Breslow, M.D. 01/14/2019, 9:52 AM PGY-3, [place]

## 2019-01-13 LAB — OBSTETRIC PANEL, INCLUDING HIV
Antibody Screen: NEGATIVE
Basophils Absolute: 0 10*3/uL (ref 0.0–0.2)
Basos: 0 %
EOS (ABSOLUTE): 0.1 10*3/uL (ref 0.0–0.4)
Eos: 1 %
HIV Screen 4th Generation wRfx: NONREACTIVE
Hematocrit: 37.7 % (ref 34.0–46.6)
Hemoglobin: 12.3 g/dL (ref 11.1–15.9)
Hepatitis B Surface Ag: NEGATIVE
Immature Grans (Abs): 0 10*3/uL (ref 0.0–0.1)
Immature Granulocytes: 0 %
Lymphocytes Absolute: 1.9 10*3/uL (ref 0.7–3.1)
Lymphs: 14 %
MCH: 28.1 pg (ref 26.6–33.0)
MCHC: 32.6 g/dL (ref 31.5–35.7)
MCV: 86 fL (ref 79–97)
Monocytes Absolute: 0.5 10*3/uL (ref 0.1–0.9)
Monocytes: 4 %
Neutrophils Absolute: 10.5 10*3/uL — ABNORMAL HIGH (ref 1.4–7.0)
Neutrophils: 81 %
Platelets: 341 10*3/uL (ref 150–450)
RBC: 4.37 x10E6/uL (ref 3.77–5.28)
RDW: 12.8 % (ref 11.7–15.4)
RPR Ser Ql: NONREACTIVE
Rh Factor: POSITIVE
Rubella Antibodies, IGG: 2.52 index (ref >0.99)
WBC: 13.1 10*3/uL — ABNORMAL HIGH (ref 3.4–10.8)

## 2019-01-13 LAB — HGB SOLU + RFLX FRAC: Sickle Solubility Test - HGBRFX: NEGATIVE

## 2019-01-14 ENCOUNTER — Encounter: Payer: Self-pay | Admitting: Family Medicine

## 2019-01-14 LAB — CULTURE, OB URINE

## 2019-01-14 LAB — URINE CULTURE, OB REFLEX

## 2019-01-14 NOTE — Assessment & Plan Note (Addendum)
Urine pregnancy test is positive in our office.  Using patient's LMP, she is currently at 14 weeks 2 days gestation.  We will obtain initial prenatal labs and schedule her for her anatomy ultrasound at around 18 weeks.  She is interested in the quad screen, which she can get at her initial OB appointment as long as it is before week 20.  She was counseled on taking prenatal vitamins every day and these are sent to her pharmacy.  She was also counseled on smoking cessation.  She was advised to return in the next week or 2 for her initial OB appointment.

## 2019-01-26 ENCOUNTER — Encounter: Payer: Medicaid Other | Admitting: Family Medicine

## 2019-02-09 ENCOUNTER — Ambulatory Visit (HOSPITAL_COMMUNITY): Payer: Medicaid Other

## 2019-02-09 ENCOUNTER — Encounter: Payer: Medicaid Other | Admitting: Family Medicine

## 2019-02-15 ENCOUNTER — Encounter: Payer: Medicaid Other | Admitting: Family Medicine

## 2019-02-18 ENCOUNTER — Other Ambulatory Visit: Payer: Self-pay

## 2019-02-18 ENCOUNTER — Ambulatory Visit (HOSPITAL_COMMUNITY)
Admission: RE | Admit: 2019-02-18 | Discharge: 2019-02-18 | Disposition: A | Discharge status: Home / Self Care | Payer: Medicaid Other | Source: Ambulatory Visit | Attending: Family Medicine | Admitting: Family Medicine

## 2019-02-18 ENCOUNTER — Other Ambulatory Visit: Payer: Self-pay | Admitting: Family Medicine

## 2019-02-18 DIAGNOSIS — Z3200 Encounter for pregnancy test, result unknown: Secondary | ICD-10-CM

## 2019-02-18 DIAGNOSIS — Z3A19 19 weeks gestation of pregnancy: Secondary | ICD-10-CM

## 2019-02-18 DIAGNOSIS — O09892 Supervision of other high risk pregnancies, second trimester: Secondary | ICD-10-CM

## 2019-02-18 DIAGNOSIS — O99332 Smoking (tobacco) complicating pregnancy, second trimester: Secondary | ICD-10-CM

## 2019-04-13 ENCOUNTER — Inpatient Hospital Stay (HOSPITAL_COMMUNITY)
Admission: EM | Admit: 2019-04-13 | Discharge: 2019-04-13 | Disposition: A | Discharge status: Home / Self Care | Payer: Medicaid Other | Attending: Obstetrics & Gynecology | Admitting: Obstetrics & Gynecology

## 2019-04-13 ENCOUNTER — Other Ambulatory Visit: Payer: Self-pay

## 2019-04-13 ENCOUNTER — Encounter (HOSPITAL_COMMUNITY): Payer: Self-pay | Admitting: Emergency Medicine

## 2019-04-13 DIAGNOSIS — O9A212 Injury, poisoning and certain other consequences of external causes complicating pregnancy, second trimester: Secondary | ICD-10-CM | POA: Diagnosis present

## 2019-04-13 DIAGNOSIS — F17210 Nicotine dependence, cigarettes, uncomplicated: Secondary | ICD-10-CM | POA: Diagnosis not present

## 2019-04-13 DIAGNOSIS — T7491XA Unspecified adult maltreatment, confirmed, initial encounter: Secondary | ICD-10-CM

## 2019-04-13 DIAGNOSIS — Z3A27 27 weeks gestation of pregnancy: Secondary | ICD-10-CM | POA: Diagnosis not present

## 2019-04-13 DIAGNOSIS — M79641 Pain in right hand: Secondary | ICD-10-CM | POA: Insufficient documentation

## 2019-04-13 DIAGNOSIS — O99332 Smoking (tobacco) complicating pregnancy, second trimester: Secondary | ICD-10-CM | POA: Diagnosis not present

## 2019-04-13 DIAGNOSIS — M25512 Pain in left shoulder: Secondary | ICD-10-CM | POA: Diagnosis not present

## 2019-04-13 DIAGNOSIS — O0932 Supervision of pregnancy with insufficient antenatal care, second trimester: Secondary | ICD-10-CM | POA: Diagnosis not present

## 2019-04-13 DIAGNOSIS — M79651 Pain in right thigh: Secondary | ICD-10-CM

## 2019-04-13 DIAGNOSIS — M542 Cervicalgia: Secondary | ICD-10-CM | POA: Insufficient documentation

## 2019-04-13 DIAGNOSIS — F17219 Nicotine dependence, cigarettes, with unspecified nicotine-induced disorders: Secondary | ICD-10-CM

## 2019-04-13 DIAGNOSIS — O26893 Other specified pregnancy related conditions, third trimester: Secondary | ICD-10-CM | POA: Insufficient documentation

## 2019-04-13 DIAGNOSIS — O0970 Supervision of high risk pregnancy due to social problems, unspecified trimester: Secondary | ICD-10-CM

## 2019-04-13 HISTORY — DX: Injury, poisoning and certain other consequences of external causes complicating pregnancy, second trimester: O9A.212

## 2019-04-13 LAB — DIFFERENTIAL
Abs Immature Granulocytes: 0.21 10*3/uL — ABNORMAL HIGH (ref 0.00–0.07)
Basophils Absolute: 0.1 10*3/uL (ref 0.0–0.1)
Basophils Relative: 0 %
Eosinophils Absolute: 0.1 10*3/uL (ref 0.0–0.5)
Eosinophils Relative: 0 %
Immature Granulocytes: 1 %
Lymphocytes Relative: 11 %
Lymphs Abs: 2 10*3/uL (ref 0.7–4.0)
Monocytes Absolute: 0.6 10*3/uL (ref 0.1–1.0)
Monocytes Relative: 3 %
Neutro Abs: 15.8 10*3/uL — ABNORMAL HIGH (ref 1.7–7.7)
Neutrophils Relative %: 85 %

## 2019-04-13 LAB — CBC
HCT: 36.3 % (ref 36.0–46.0)
Hemoglobin: 12.1 g/dL (ref 12.0–15.0)
MCH: 28.3 pg (ref 26.0–34.0)
MCHC: 33.3 g/dL (ref 30.0–36.0)
MCV: 84.8 fL (ref 80.0–100.0)
Platelets: 279 10*3/uL (ref 150–400)
RBC: 4.28 MIL/uL (ref 3.87–5.11)
RDW: 13.5 % (ref 11.5–15.5)
WBC: 18.7 10*3/uL — ABNORMAL HIGH (ref 4.0–10.5)
nRBC: 0 % (ref 0.0–0.2)

## 2019-04-13 LAB — TYPE AND SCREEN
ABO/RH(D): O POS
Antibody Screen: NEGATIVE

## 2019-04-13 LAB — HIV ANTIBODY (ROUTINE TESTING W REFLEX): HIV Screen 4th Generation wRfx: NONREACTIVE

## 2019-04-13 LAB — HEPATITIS B SURFACE ANTIGEN: Hepatitis B Surface Ag: NONREACTIVE

## 2019-04-13 NOTE — Discharge Instructions (Signed)
Psychiatric Services °[facility]  °[address], [place], [place] °[phone] ° °[facility] °[address], [place], [place] °Helpline 336-832-9700 or 1-800-711-2635 °[email or web address] ° °[facility] °Walk-in Mon-Fri, 8:30-5:00 °[address], [place], [place] °[phone] °www.[email or web address]  °*Bring your own interpreter at 1st visit ° °[facility] °[address], [place], [place] °[phone] °[email or web address]  ° °[facility]  °[address], [place], [place] °[phone] ° °[facility] °Walk-in Mon-Fri, 8am-3pm °[address], [place], [place] °[phone] °www.[facility].org ° °Therapy/Counseling ° °[facility] °[address], [place], [place] °[phone] ° °[facility] °[address], [place], [place] °[phone] ° °[facility] °[address], [place], [place] °[phone] °*pacientes que hablen espanol, favor comunicarse con el Sr. [place], extension 2244 o la [address], extension 3331 para hacer una cita.  ° °[facility] °[address] [place]” °[phone] (Habla Espanol) ° °[facility] °[address], [place], [place] °[phone] ° °[facility] °[address], [place], [place] °[phone] °  °[facility]  °[address], [place], [place] °[phone] °www.[email or web address]org  °*Uninsured and underinsured, ages 19-64 ° °[facility] °[address], [place], [place] °[phone] (Habla Espanol) ° °[facility] °[address], [place], [place] °[phone] (Habla Espanol) ° °[facility] °[address], [place], [place] °[phone] (Habla  Espanol) ° °[facility] °Mon-Thurs 8:30am-8:00pm/ Fri 8:30-7:00pm °[address], [place], [place] °(3rd floor, located at corner of [address]) °Call [phone] to schedule an appointment °[email or web address] ° °[facility] °[address], [place], [place]  °336-542-2884 ° °[facility]  °[address], [place], [place] °336-375-8333 ° °Social Support °[facility]) °(336) 373-1402 or www.[facility].org °[address], [place], [place] [address] °* Recovery support and educational programs, including recovery skills classes, support groups, and one-on-one sessions with  Certified Peer Support Specialists.  ° ° °[facility] ([facility]) [place] °[facility] Helpline: (336) 370-4264 °* Family and Friends Support Group/  Contact Jack Glenn at [phone] for more information °* Family to Family Education Program and Basics Class : enroll online or email Mary Pennington at [email or web address]  °* Monthly educational meetings, contact Mitch McGee at [phone] °[email or web address]  ° ° °24- Hour Availability:  °*[facility] 336-832-9700 or 1-800-711-2635 ° °* [facility] Crisis (Domestic Violence, Rape, Victim Assistance) Line 336-273-7273 ° °* [facility] 1-855-788-8787 or 336-676-6840 ° °* [facility] [place] [facility]  °336-899-1505(8am-4pm only) °1-866-261-5769(after hours) ° °*[facility] °1-877-626-1772 ° °*[place] National Suicide Hotline °1-800-273-8255 ° ° °
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
## 2019-04-14 LAB — RUBELLA SCREEN: Rubella: 2.64 index (ref >0.99)

## 2019-04-14 LAB — RPR: RPR Ser Ql: NONREACTIVE

## 2019-04-15 DIAGNOSIS — O0970 Supervision of high risk pregnancy due to social problems, unspecified trimester: Secondary | ICD-10-CM

## 2021-07-16 ENCOUNTER — Encounter: Payer: Self-pay | Admitting: *Deleted
